# Patient Record
Sex: Female | Born: 1965
Health system: Southern US, Community
[De-identification: ages and names within clinical notes are randomized; demographics above are authoritative.]

## PROBLEM LIST (undated history)

## (undated) DIAGNOSIS — F329 Major depressive disorder, single episode, unspecified: Secondary | ICD-10-CM

## (undated) DIAGNOSIS — N907 Vulvar cyst: Secondary | ICD-10-CM

## (undated) DIAGNOSIS — F32A Depression, unspecified: Secondary | ICD-10-CM

## (undated) DIAGNOSIS — IMO0002 Reserved for concepts with insufficient information to code with codable children: Secondary | ICD-10-CM

## (undated) DIAGNOSIS — S7290XA Unspecified fracture of unspecified femur, initial encounter for closed fracture: Secondary | ICD-10-CM

## (undated) DIAGNOSIS — S069XAA Unspecified intracranial injury with loss of consciousness status unknown, initial encounter: Secondary | ICD-10-CM

## (undated) DIAGNOSIS — J069 Acute upper respiratory infection, unspecified: Secondary | ICD-10-CM

## (undated) DIAGNOSIS — J45909 Unspecified asthma, uncomplicated: Secondary | ICD-10-CM

## (undated) DIAGNOSIS — S069X9A Unspecified intracranial injury with loss of consciousness of unspecified duration, initial encounter: Secondary | ICD-10-CM

## (undated) DIAGNOSIS — R4020 Unspecified coma: Secondary | ICD-10-CM

## (undated) HISTORY — PX: TYMPANOSTOMY TUBE PLACEMENT: SHX32

## (undated) HISTORY — DX: Unspecified fracture of unspecified femur, initial encounter for closed fracture: S72.90XA

## (undated) HISTORY — DX: Vulvar cyst: N90.7

## (undated) HISTORY — DX: Unspecified asthma, uncomplicated: J45.909

## (undated) HISTORY — DX: Depression, unspecified: F32.A

## (undated) HISTORY — DX: Unspecified intracranial injury with loss of consciousness of unspecified duration, initial encounter: S06.9X9A

## (undated) HISTORY — DX: Reserved for concepts with insufficient information to code with codable children: IMO0002

## (undated) HISTORY — DX: Unspecified intracranial injury with loss of consciousness status unknown, initial encounter: S06.9XAA

## (undated) HISTORY — DX: Acute upper respiratory infection, unspecified: J06.9

## (undated) HISTORY — PX: MASTOIDECTOMY: SHX711

## (undated) HISTORY — DX: Unspecified coma: R40.20

## (undated) HISTORY — DX: Major depressive disorder, single episode, unspecified: F32.9

## (undated) HISTORY — PX: TONSILLECTOMY AND ADENOIDECTOMY: SUR1326

---

## 1999-03-07 ENCOUNTER — Other Ambulatory Visit: Admission: RE | Admit: 1999-03-07 | Discharge: 1999-03-07 | Payer: Self-pay | Admitting: *Deleted

## 2000-03-19 ENCOUNTER — Other Ambulatory Visit: Admission: RE | Admit: 2000-03-19 | Discharge: 2000-03-19 | Payer: Self-pay | Admitting: *Deleted

## 2001-03-28 ENCOUNTER — Other Ambulatory Visit: Admission: RE | Admit: 2001-03-28 | Discharge: 2001-03-28 | Payer: Self-pay | Admitting: *Deleted

## 2002-05-08 ENCOUNTER — Other Ambulatory Visit: Admission: RE | Admit: 2002-05-08 | Discharge: 2002-05-08 | Payer: Self-pay | Admitting: *Deleted

## 2003-07-12 ENCOUNTER — Inpatient Hospital Stay (HOSPITAL_COMMUNITY): Admission: AD | Admit: 2003-07-12 | Discharge: 2003-07-15 | Payer: Self-pay | Admitting: Obstetrics & Gynecology

## 2003-07-13 ENCOUNTER — Encounter (INDEPENDENT_AMBULATORY_CARE_PROVIDER_SITE_OTHER): Payer: Self-pay | Admitting: Specialist

## 2003-07-16 ENCOUNTER — Encounter: Admission: RE | Admit: 2003-07-16 | Discharge: 2003-08-15 | Payer: Self-pay | Admitting: *Deleted

## 2004-08-03 ENCOUNTER — Other Ambulatory Visit: Admission: RE | Admit: 2004-08-03 | Discharge: 2004-08-03 | Payer: Self-pay | Admitting: *Deleted

## 2005-09-27 ENCOUNTER — Other Ambulatory Visit: Admission: RE | Admit: 2005-09-27 | Discharge: 2005-09-27 | Payer: Self-pay | Admitting: *Deleted

## 2005-11-03 ENCOUNTER — Encounter: Admission: RE | Admit: 2005-11-03 | Discharge: 2005-11-03 | Payer: Self-pay | Admitting: *Deleted

## 2006-11-21 ENCOUNTER — Other Ambulatory Visit: Admission: RE | Admit: 2006-11-21 | Discharge: 2006-11-21 | Payer: Self-pay | Admitting: *Deleted

## 2007-04-12 ENCOUNTER — Encounter: Admission: RE | Admit: 2007-04-12 | Discharge: 2007-04-12 | Payer: Self-pay | Admitting: *Deleted

## 2007-12-10 ENCOUNTER — Other Ambulatory Visit: Admission: RE | Admit: 2007-12-10 | Discharge: 2007-12-10 | Payer: Self-pay | Admitting: *Deleted

## 2008-04-14 ENCOUNTER — Encounter: Admission: RE | Admit: 2008-04-14 | Discharge: 2008-04-14 | Payer: Self-pay | Admitting: *Deleted

## 2009-05-19 ENCOUNTER — Encounter: Admission: RE | Admit: 2009-05-19 | Discharge: 2009-05-19 | Payer: Self-pay | Admitting: Gynecology

## 2010-06-13 ENCOUNTER — Ambulatory Visit: Payer: Self-pay | Admitting: Gynecology

## 2010-06-13 ENCOUNTER — Other Ambulatory Visit: Admission: RE | Admit: 2010-06-13 | Discharge: 2010-06-13 | Payer: Self-pay | Admitting: Gynecology

## 2010-06-17 ENCOUNTER — Ambulatory Visit: Payer: Self-pay | Admitting: Gynecology

## 2010-06-17 DIAGNOSIS — N907 Vulvar cyst: Secondary | ICD-10-CM

## 2010-06-17 HISTORY — DX: Vulvar cyst: N90.7

## 2011-06-22 ENCOUNTER — Ambulatory Visit (INDEPENDENT_AMBULATORY_CARE_PROVIDER_SITE_OTHER): Payer: Medicare Other | Admitting: Gynecology

## 2011-06-22 ENCOUNTER — Encounter: Payer: Self-pay | Admitting: Gynecology

## 2011-06-22 DIAGNOSIS — R82998 Other abnormal findings in urine: Secondary | ICD-10-CM

## 2011-06-22 DIAGNOSIS — N949 Unspecified condition associated with female genital organs and menstrual cycle: Secondary | ICD-10-CM

## 2011-06-22 DIAGNOSIS — B3731 Acute candidiasis of vulva and vagina: Secondary | ICD-10-CM

## 2011-06-22 DIAGNOSIS — N898 Other specified noninflammatory disorders of vagina: Secondary | ICD-10-CM

## 2011-06-22 DIAGNOSIS — B373 Candidiasis of vulva and vagina: Secondary | ICD-10-CM

## 2011-06-22 DIAGNOSIS — Z202 Contact with and (suspected) exposure to infections with a predominantly sexual mode of transmission: Secondary | ICD-10-CM

## 2011-06-22 DIAGNOSIS — IMO0002 Reserved for concepts with insufficient information to code with codable children: Secondary | ICD-10-CM

## 2011-06-22 LAB — RPR

## 2011-06-22 LAB — HEPATITIS C ANTIBODY: HCV Ab: NEGATIVE

## 2011-06-22 MED ORDER — FLUCONAZOLE 150 MG PO TABS: 150.0000 mg | ORAL_TABLET | Freq: Once | ORAL | Status: AC

## 2011-06-22 NOTE — Progress Notes (Signed)
Melinda Anderson 05/29/1966 [id number]        45 y.o. presents for exam possible exposure to STDs. Patient relates having unprotected intercourse last week x2. She does not know of any known exposure but did not know her partner that well and is concerned she may have been exposed to STDs. She does note some vaginal irritation and some irritative dyspareunia over the last several weeks.  Past medical history,surgical history, allergies, family history and social history were all reviewed and documented in the EPIC chart. ROS:  Was performed and pertinent positives and negatives are included in the history.  Exam: chaperone present Filed Vitals:   06/22/11 1446  BP: 116/62   General appearance  Normal Skin grossly normal Head/Neck normal with no cervical or supraclavicular adenopathy thyroid normal Lungs  clear Cardiac RR, without RMG Abdominal  soft, nontender, without masses, organomegaly or hernia Breasts  examined lying and sitting without masses, retractions, discharge or axillary adenopathy. Pelvic  Ext/BUS/vagina  normal with whitish discharge KOH wet prep done  Cervix  normal  GC Chlamydia screen done Pap not done  Uterus  anteverted, normal size, shape and contour, midline and mobile nontender   Adnexa  Without masses or tenderness    Anus and perineum  normal   Rectovaginal  normal sphincter tone without palpated masses or tenderness.    Assessment/Plan:  45 y.o. female   #1 Recent unprotected intercourse worried about STDs. I did a baseline GC Chlamydia RPR HIV hep B and hep C. I did discuss with her that the serum screens would be too early to check for recent exposure and that she is concerned with the most recent exposure that she should repeat her blood screening 3-6 months to make sure that they remain negative assuming that these are negative.   #2 Wet prep did show yeast we'll treat with Diflucan 150x1 dose.  This may account surly for vaginal irritation now as well  as her irritative dyspareunia negative for discomfort results will follow if it persists or recurs and for further evaluation.  #3 Birth control. I discussed the birth control with her. She is using condoms although did not this last episode. Based on her last menstrual period I doubt that she is at risk for pregnancy but this last coital episode last week.  She will wait to her menses and if does not start she will check pregnancy test. I did review with her birth control options. Patient states she is usually not that such an active perverse condoms and does not want to discuss alternatives. I did review the availability of Plan B in the event of either slipping or breaking of condoms or unprotected intercourse.  #4 Health maintenance. Self breast exams on a monthly basis discussed and encouraged. Patient relates having mammograms yearly. In review of her chart her last mammogram was in 2010 but she is very certain that she had one this past year and I asked her just to check to make sure that she did this.. I did not do a Pap smear today she had one last year that was normal she again swears that she was having them annually and that she has never had an abnormal Pap smear.   We will go to a less frequent screening protocol every 2-3 years. She is comfortable with this.  No routine blood work was done today and she is planning to see primary for routine health care and will arrange for it to their office.  Dara Lords MD, 4:37 PM 06/22/2011

## 2011-06-22 NOTE — Progress Notes (Signed)
Addended byCammie Mcgee T on: 06/22/2011 05:07 PM   Modules accepted: Orders

## 2011-06-26 ENCOUNTER — Telehealth: Payer: Self-pay | Admitting: *Deleted

## 2011-06-26 NOTE — Telephone Encounter (Signed)
LAB RESULT NOT BACK

## 2011-06-28 NOTE — Telephone Encounter (Signed)
PT INFORMED WITH LAB RESULT ON 06/22/11 HIV, HEP C & B, RPR.  PT INFORMED THAT GC/CHLAM WILL RAN ON Thursday AFTERNOON.

## 2012-06-24 ENCOUNTER — Other Ambulatory Visit (HOSPITAL_COMMUNITY)
Admission: RE | Admit: 2012-06-24 | Discharge: 2012-06-24 | Disposition: A | Discharge status: Home / Self Care | Payer: Medicare Other | Source: Ambulatory Visit | Attending: Gynecology | Admitting: Gynecology

## 2012-06-24 ENCOUNTER — Ambulatory Visit (INDEPENDENT_AMBULATORY_CARE_PROVIDER_SITE_OTHER): Payer: Medicare Other | Admitting: Gynecology

## 2012-06-24 ENCOUNTER — Encounter: Payer: Self-pay | Admitting: Gynecology

## 2012-06-24 VITALS — BP 110/60 | Ht 60.0 in | Wt 106.0 lb

## 2012-06-24 DIAGNOSIS — Z1151 Encounter for screening for human papillomavirus (HPV): Secondary | ICD-10-CM | POA: Insufficient documentation

## 2012-06-24 DIAGNOSIS — R5383 Other fatigue: Secondary | ICD-10-CM

## 2012-06-24 DIAGNOSIS — N899 Noninflammatory disorder of vagina, unspecified: Secondary | ICD-10-CM

## 2012-06-24 DIAGNOSIS — R5381 Other malaise: Secondary | ICD-10-CM

## 2012-06-24 DIAGNOSIS — Z124 Encounter for screening for malignant neoplasm of cervix: Secondary | ICD-10-CM

## 2012-06-24 DIAGNOSIS — N898 Other specified noninflammatory disorders of vagina: Secondary | ICD-10-CM

## 2012-06-24 DIAGNOSIS — N951 Menopausal and female climacteric states: Secondary | ICD-10-CM

## 2012-06-24 DIAGNOSIS — IMO0002 Reserved for concepts with insufficient information to code with codable children: Secondary | ICD-10-CM

## 2012-06-24 LAB — CBC WITH DIFFERENTIAL/PLATELET
Basophils Absolute: 0 10*3/uL (ref 0.0–0.1)
Eosinophils Relative: 3 % (ref 0–5)
HCT: 41.7 % (ref 36.0–46.0)
Hemoglobin: 14.3 g/dL (ref 12.0–15.0)
Lymphocytes Relative: 32 % (ref 12–46)
Lymphs Abs: 1.5 10*3/uL (ref 0.7–4.0)
MCV: 90.7 fL (ref 78.0–100.0)
Monocytes Absolute: 0.5 10*3/uL (ref 0.1–1.0)
Monocytes Relative: 10 % (ref 3–12)
Neutro Abs: 2.6 10*3/uL (ref 1.7–7.7)
RBC: 4.6 MIL/uL (ref 3.87–5.11)
RDW: 14.2 % (ref 11.5–15.5)
WBC: 4.7 10*3/uL (ref 4.0–10.5)

## 2012-06-24 LAB — COMPREHENSIVE METABOLIC PANEL
AST: 20 U/L (ref 0–37)
Albumin: 4.3 g/dL (ref 3.5–5.2)
BUN: 11 mg/dL (ref 6–23)
CO2: 26 mEq/L (ref 19–32)
Calcium: 9.3 mg/dL (ref 8.4–10.5)
Chloride: 103 mEq/L (ref 96–112)
Creat: 0.9 mg/dL (ref 0.50–1.10)
Potassium: 4.3 mEq/L (ref 3.5–5.3)

## 2012-06-24 LAB — WET PREP FOR TRICH, YEAST, CLUE
Clue Cells Wet Prep HPF POC: NONE SEEN
Trich, Wet Prep: NONE SEEN
Yeast Wet Prep HPF POC: NONE SEEN

## 2012-06-24 LAB — FOLLICLE STIMULATING HORMONE: FSH: 21.9 m[IU]/mL

## 2012-06-24 MED ORDER — FLUCONAZOLE 200 MG PO TABS: 200.0000 mg | ORAL_TABLET | Freq: Every day | ORAL | Status: DC

## 2012-06-24 NOTE — Patient Instructions (Signed)
Follow up for ultrasound as scheduled and lab work.  Consider Stop Smoking.  Help is available at [facility] smoking cessation program @ [email or web address] or (931)062-0952. OR 1-800-QUIT-NOW 629-232-4978) for free smoking cessation counseling.   Smoking Hazards Smoking cigarettes is extremely bad for your health. Tobacco smoke has over 200 known poisons in it. There are over 60 chemicals in tobacco smoke that cause cancer. Some of the chemicals found in cigarette smoke include:  Cyanide.  Benzene.  Formaldehyde.  Methanol (wood alcohol).  Acetylene (fuel used in welding torches).  Ammonia.  Cigarette smoke also contains the poisonous gases nitrogen oxide and carbon monoxide.  Cigarette smokers have an increased risk of many serious medical problems, including: Lung cancer.  Lung disease (such as pneumonia, bronchitis, and emphysema).  Heart attack and chest pain due to the heart not getting enough oxygen (angina).  Heart disease and peripheral blood vessel disease.  Hypertension.  Stroke.  Oral cancer (cancer of the lip, mouth, or voice box).  Bladder cancer.  Pancreatic cancer.  Cervical cancer.  Pregnancy complications, including premature birth.  Low birthweight babies.  Early menopause.  Lower estrogen level for women.  Infertility.  Facial wrinkles.  Blindness.  Increased risk of broken bones (fractures).  Senile dementia.  Stillbirths and smaller newborn babies, birth defects, and genetic damage to sperm.  Stomach ulcers and internal bleeding.  Children of smokers have an increased risk of the following, because of secondhand smoke exposure:  Sudden infant death syndrome (SIDS).  Respiratory infections.  Lung cancer.  Heart disease.  Ear infections.  Smoking causes approximately: 90% of all lung cancer deaths in men.  80% of all lung cancer deaths in women.  90% of deaths from chronic obstructive lung disease.  Compared with nonsmokers, smoking  increases the risk of: Coronary heart disease by 2 to 4 times.  Stroke by 2 to 4 times.  Men developing lung cancer by 23 times.  Women developing lung cancer by 13 times.  Dying from chronic obstructive lung diseases by 12 times.  Someone who smokes 2 packs a day loses about 8 years of his or her life. Even smoking lightly shortens your life expectancy by several years. You can greatly reduce the risk of medical problems for you and your family by stopping now. Smoking is the most preventable cause of death and disease in our society. Within days of quitting smoking, your circulation returns to normal, you decrease the risk of having a heart attack, and your lung capacity improves. There may be some increased phlegm in the first few days after quitting, and it may take months for your lungs to clear up completely. Quitting for 10 years cuts your lung cancer risk to almost that of a nonsmoker. WHY IS SMOKING ADDICTIVE? Nicotine is the chemical agent in tobacco that is capable of causing addiction or dependence.  When you smoke and inhale, nicotine is absorbed rapidly into the bloodstream through your lungs. Nicotine absorbed through the lungs is capable of creating a powerful addiction. Both inhaled and non-inhaled nicotine may be addictive.  Addiction studies of cigarettes and spit tobacco show that addiction to nicotine occurs mainly during the teen years, when young people begin using tobacco products.  WHAT ARE THE BENEFITS OF QUITTING?  There are many health benefits to quitting smoking.  Likelihood of developing cancer and heart disease decreases. Health improvements are seen almost immediately.  Blood pressure, pulse rate, and breathing patterns start returning to normal soon after quitting.  People  who quit may see an improvement in their overall quality of life.  Some people choose to quit all at once. Other options include nicotine replacement products, such as patches, gum, and nasal  sprays. Do not use these products without first checking with your caregiver. QUITTING SMOKING It is not easy to quit smoking. Nicotine is addicting, and longtime habits are hard to change. To start, you can write down all your reasons for quitting, tell your family and friends you want to quit, and ask for their help. Throw your cigarettes away, chew gum or cinnamon sticks, keep your hands busy, and drink extra water or juice. Go for walks and practice deep breathing to relax. Think of all the money you are saving: around $1,000 a year, for the average pack-a-day smoker. Nicotine patches and gum have been shown to improve success at efforts to stop smoking. Zyban (bupropion) is an anti-depressant drug that can be prescribed to reduce nicotine withdrawal symptoms and to suppress the urge to smoke. Smoking is an addiction with both physical and psychological effects. Joining a stop-smoking support group can help you cope with the emotional issues. For more information and advice on programs to stop smoking, call your doctor, your local hospital, or these organizations: American Lung Association - 1-800-LUNGUSA  American Cancer Society - 1-800-ACS-2345  Document Released: 12/07/2004 Document Revised: 07/12/2011 Document Reviewed: 08/11/2009 [facility] Patient Information 2012 [place], [place].  Smoking Cessation This document explains the best ways for you to quit smoking and new treatments to help. It lists new medicines that can double or triple your chances of quitting and quitting for good. It also considers ways to avoid relapses and concerns you may have about quitting, including weight gain. NICOTINE: A POWERFUL ADDICTION If you have tried to quit smoking, you know how hard it can be. It is hard because nicotine is a very addictive drug. For some people, it can be as addictive as heroin or cocaine. Usually, people make 2 or 3 tries, or more, before finally being able to quit. Each time you try to quit,  you can learn about what helps and what hurts. Quitting takes hard work and a lot of effort, but you can quit smoking. QUITTING SMOKING IS ONE OF THE MOST IMPORTANT THINGS YOU WILL EVER DO.  You will live longer, feel better, and live better.   The impact on your body of quitting smoking is felt almost immediately:   Within 20 minutes, blood pressure decreases. Pulse returns to its normal level.   After 8 hours, carbon monoxide levels in the blood return to normal. Oxygen level increases.   After 24 hours, chance of heart attack starts to decrease. Breath, hair, and body stop smelling like smoke.   After 48 hours, damaged nerve endings begin to recover. Sense of taste and smell improve.   After 72 hours, the body is virtually free of nicotine. Bronchial tubes relax and breathing becomes easier.   After 2 to 12 weeks, lungs can hold more air. Exercise becomes easier and circulation improves.   Quitting will reduce your risk of having a heart attack, stroke, cancer, or lung disease:   After 1 year, the risk of coronary heart disease is cut in half.   After 5 years, the risk of stroke falls to the same as a nonsmoker.   After 10 years, the risk of lung cancer is cut in half and the risk of other cancers decreases significantly.   After 15 years, the risk of coronary  heart disease drops, usually to the level of a nonsmoker.   If you are pregnant, quitting smoking will improve your chances of having a healthy baby.   The people you live with, especially your children, will be healthier.   You will have extra money to spend on things other than cigarettes.  FIVE KEYS TO QUITTING Studies have shown that these 5 steps will help you quit smoking and quit for good. You have the best chances of quitting if you use them together: 1. Get ready.  2. Get support and encouragement.  3. Learn new skills and behaviors.  4. Get medicine to reduce your nicotine addiction and use it correctly.   5. Be prepared for relapse or difficult situations. Be determined to continue trying to quit, even if you do not succeed at first.  1. GET READY  Set a quit date.   Change your environment.   Get rid of ALL cigarettes, ashtrays, matches, and lighters in your home, car, and place of work.   Do not let people smoke in your home.   Review your past attempts to quit. Think about what worked and what did not.   Once you quit, do not smoke. NOT EVEN A PUFF!  2. GET SUPPORT AND ENCOURAGEMENT Studies have shown that you have a better chance of being successful if you have help. You can get support in many ways.  Tell your family, friends, and coworkers that you are going to quit and need their support. Ask them not to smoke around you.   Talk to your caregivers (doctor, dentist, nurse, pharmacist, psychologist, and/or smoking counselor).   Get individual, group, or telephone counseling and support. The more counseling you have, the better your chances are of quitting. Programs are available at [employer] and health centers. Call your local health department for information about programs in your area.   Spiritual beliefs and practices may help some smokers quit.   Quit meters are Photographer that keep track of quit statistics, such as amount of "quit-time," cigarettes not smoked, and money saved.   Many smokers find one or more of the many self-help books available useful in helping them quit and stay off tobacco.  3. LEARN NEW SKILLS AND BEHAVIORS  Try to distract yourself from urges to smoke. Talk to someone, go for a walk, or occupy your time with a task.   When you first try to quit, change your routine. Take a different route to work. Drink tea instead of coffee. Eat breakfast in a different place.   Do something to reduce your stress. Take a hot bath, exercise, or read a book.   Plan something enjoyable to do every day. Reward yourself for  not smoking.   Explore interactive web-based programs that specialize in helping you quit.  4. GET MEDICINE AND USE IT CORRECTLY Medicines can help you stop smoking and decrease the urge to smoke. Combining medicine with the above behavioral methods and support can quadruple your chances of successfully quitting smoking. The U.S. Food and Drug Administration (FDA) has approved 7 medicines to help you quit smoking. These medicines fall into 3 categories.  Nicotine replacement therapy (delivers nicotine to your body without the negative effects and risks of smoking):   Nicotine gum: Available over-the-counter.   Nicotine lozenges: Available over-the-counter.   Nicotine inhaler: Available by prescription.   Nicotine nasal spray: Available by prescription.   Nicotine skin patches (transdermal): Available by prescription and over-the-counter.  Antidepressant medicine (helps people abstain from smoking, but how this works is unknown):   Bupropion sustained-release (SR) tablets: Available by prescription.   Nicotinic receptor partial agonist (simulates the effect of nicotine in your brain):   Varenicline tartrate tablets: Available by prescription.   Ask your caregiver for advice about which medicines to use and how to use them. Carefully read the information on the package.   Everyone who is trying to quit may benefit from using a medicine. If you are pregnant or trying to become pregnant, nursing an infant, you are under age 26, or you smoke fewer than 10 cigarettes per day, talk to your caregiver before taking any nicotine replacement medicines.   You should stop using a nicotine replacement product and call your caregiver if you experience nausea, dizziness, weakness, vomiting, fast or irregular heartbeat, mouth problems with the lozenge or gum, or redness or swelling of the skin around the patch that does not go away.   Do not use any other product containing nicotine while using a  nicotine replacement product.   Talk to your caregiver before using these products if you have diabetes, heart disease, asthma, stomach ulcers, you had a recent heart attack, you have high blood pressure that is not controlled with medicine, a history of irregular heartbeat, or you have been prescribed medicine to help you quit smoking.  5. BE PREPARED FOR RELAPSE OR DIFFICULT SITUATIONS  Most relapses occur within the first 3 months after quitting. Do not be discouraged if you start smoking again. Remember, most people try several times before they finally quit.   You may have symptoms of withdrawal because your body is used to nicotine. You may crave cigarettes, be irritable, feel very hungry, cough often, get headaches, or have difficulty concentrating.   The withdrawal symptoms are only temporary. They are strongest when you first quit, but they will go away within 10 to 14 days.  Here are some difficult situations to watch for:  Alcohol. Avoid drinking alcohol. Drinking lowers your chances of successfully quitting.   Caffeine. Try to reduce the amount of caffeine you consume. It also lowers your chances of successfully quitting.   Other smokers. Being around smoking can make you want to smoke. Avoid smokers.   Weight gain. Many smokers will gain weight when they quit, usually less than 10 pounds. Eat a healthy diet and stay active. Do not let weight gain distract you from your main goal, quitting smoking. Some medicines that help you quit smoking may also help delay weight gain. You can always lose the weight gained after you quit.   Bad mood or depression. There are a lot of ways to improve your mood other than smoking.  If you are having problems with any of these situations, talk to your caregiver. SPECIAL SITUATIONS AND CONDITIONS Studies suggest that everyone can quit smoking. Your situation or condition can give you a special reason to quit.  Pregnant women/new mothers: By  quitting, you protect your baby's health and your own.   Hospitalized patients: By quitting, you reduce health problems and help healing.   Heart attack patients: By quitting, you reduce your risk of a second heart attack.   Lung, head, and neck cancer patients: By quitting, you reduce your chance of a second cancer.   Parents of children and adolescents: By quitting, you protect your children from illnesses caused by secondhand smoke.  QUESTIONS TO THINK ABOUT Think about the following questions before you try to stop smoking.  You may want to talk about your answers with your caregiver.  Why do you want to quit?   If you tried to quit in the past, what helped and what did not?   What will be the most difficult situations for you after you quit? How will you plan to handle them?   Who can help you through the tough times? Your family? Friends? Caregiver?   What pleasures do you get from smoking? What ways can you still get pleasure if you quit?  Here are some questions to ask your caregiver:  How can you help me to be successful at quitting?   What medicine do you think would be best for me and how should I take it?   What should I do if I need more help?   What is smoking withdrawal like? How can I get information on withdrawal?  Quitting takes hard work and a lot of effort, but you can quit smoking. FOR MORE INFORMATION  Smokefree.gov (http://www.davis-sullivan.com/) provides free, accurate, evidence-based information and professional assistance to help support the immediate and long-term needs of people trying to quit smoking. Document Released: 10/24/2001 Document Revised: 07/12/2011 Document Reviewed: 08/16/2009 [facility] Patient Information 2012 [place], [place].

## 2012-06-24 NOTE — Progress Notes (Signed)
Melinda Anderson 05/04/1966 [id number]        46 y.o.  G2P0011 for follow up exam.  Several issues noted below.  Past medical history,surgical history, medications, allergies, family history and social history were all reviewed and documented in the EPIC chart. ROS:  Was performed and pertinent positives and negatives are included in the history.  Exam:  Sherrilyn Rist assistant Filed Vitals:   06/24/12 0959  BP: 110/60  Height: 5' (1.524 m)  Weight: 106 lb (48.081 kg)   General appearance  Normal Skin grossly normal Head/Neck normal with no cervical or supraclavicular adenopathy thyroid normal, slight whitish change to the tongue. Lungs  clear Cardiac RR, without RMG Abdominal  soft, nontender, without masses, organomegaly or hernia Breasts  examined lying and sitting without masses, retractions, discharge or axillary adenopathy. Pelvic  Ext/BUS/vagina  normal with white discharge  Cervix  normal Pap/HPV  Uterus  anteverted, normal size, shape and contour, midline and mobile nontender   Adnexa  Without masses or tenderness    Anus and perineum  normal   Rectovaginal  normal sphincter tone without palpated masses or tenderness.    Assessment/Plan:  46 y.o. G48P0011 female for follow up exam.   1. Vaginal irritation/thrush. Patient was treated with oral antibiotics for a root canal. Notes afterwards developed vaginal irritation and thrush. She tends to get thrush with oral antibiotics. Exam is consistent with thrush and yeast vulvovaginitis. The wet prep was negative. We'll treat with Diflucan 200 daily x7 days. Follow up if symptoms persist or recur. 2. Contraception. Patient continues use condoms. I reviewed the failure risk and availability of Plan B. Alternatives to include sterilization, IUD and progesterone only hormonal reviewed. She does smoke. Patient declines alternatives and prefers condoms accepting the failure risks. 3. Dyspareunia. Patient notes irritative and deep throbbing  dyspareunia. Exam is normal other than her vaginitis. We'll treat the vaginitis and check ultrasound rule out nonpalpable abnormalities. Assuming ultrasound negative will monitor at present. Long-term consider laparoscopy rule out endometriosis. 4. Menopausal symptoms. Patient notes some hot flashes during her menses. None otherwise. We'll check baseline FSH. 5. Fatigue. I think this is due to travel in a busy summer but will check baseline TSH contents metabolic panel and CBC. Assuming negative will monitor at present. If continues referred to internal medicine. 6. Pap smear. The patient has not had a Pap smear in several years. Pap/HPV done. No history of abnormal Pap smears before. If negative then plan every 5 year screening. 7. Mammography. Patient overdue for mammogram and I reminded her to schedule this and she agrees to do so. SBE monthly reviewed. 8. Stop smoking. I reviewed the benefits of stop smoking and encouraged her to do so. 9. Health maintenance. Patient will continue to see her primary for routine health maintenance. Follow up for ultrasound and lab work.    Dara Lords MD, 10:35 AM 06/24/2012

## 2012-06-25 LAB — URINALYSIS W MICROSCOPIC + REFLEX CULTURE
Bilirubin Urine: NEGATIVE
Glucose, UA: NEGATIVE mg/dL
Hgb urine dipstick: NEGATIVE
Protein, ur: NEGATIVE mg/dL
Urobilinogen, UA: 0.2 mg/dL (ref 0.0–1.0)

## 2012-06-28 ENCOUNTER — Ambulatory Visit (INDEPENDENT_AMBULATORY_CARE_PROVIDER_SITE_OTHER): Payer: Medicare Other

## 2012-06-28 ENCOUNTER — Encounter: Payer: Self-pay | Admitting: Gynecology

## 2012-06-28 ENCOUNTER — Ambulatory Visit (INDEPENDENT_AMBULATORY_CARE_PROVIDER_SITE_OTHER): Payer: Medicare Other | Admitting: Gynecology

## 2012-06-28 DIAGNOSIS — D259 Leiomyoma of uterus, unspecified: Secondary | ICD-10-CM

## 2012-06-28 DIAGNOSIS — IMO0002 Reserved for concepts with insufficient information to code with codable children: Secondary | ICD-10-CM

## 2012-06-28 MED ORDER — FLUCONAZOLE 200 MG PO TABS: 200.0000 mg | ORAL_TABLET | Freq: Every day | ORAL | Status: AC

## 2012-06-28 NOTE — Patient Instructions (Signed)
Take Diflucan 200 mg daily for 7 days. If your bowel symptoms continue he will need to see your primary physician or your dentist.

## 2012-06-28 NOTE — Progress Notes (Signed)
Patient presents for ultrasound due to her history of deep dyspareunia.  Ultrasound shows uterus normal size. The endometrial echo of 5.3 mm. Several small myomas noted the largest measuring 13 mm. Bilateral ovaries normal. Cul-de-sac negative.  Assessment and plan: Reviewed findings of ultrasound with patient. Options for more aggressive evaluation to include laparoscopy reviewed but she declines said that it's always been this way and she is comfortable with monitoring. She took one Diflucan apparently there was a mixup at the pharmacy instead of 7 she got one and I refilled her for a total of 7 to take one by mouth daily. If her thrush continues she'll need to see her dentist or primary physician.

## 2012-10-16 ENCOUNTER — Encounter (HOSPITAL_COMMUNITY): Payer: Self-pay | Admitting: Emergency Medicine

## 2012-10-16 ENCOUNTER — Emergency Department (HOSPITAL_COMMUNITY)
Admission: EM | Admit: 2012-10-16 | Discharge: 2012-10-16 | Disposition: A | Discharge status: Home / Self Care | Payer: No Typology Code available for payment source | Attending: Emergency Medicine | Admitting: Emergency Medicine

## 2012-10-16 DIAGNOSIS — H8110 Benign paroxysmal vertigo, unspecified ear: Secondary | ICD-10-CM

## 2012-10-16 DIAGNOSIS — Z87820 Personal history of traumatic brain injury: Secondary | ICD-10-CM | POA: Insufficient documentation

## 2012-10-16 DIAGNOSIS — Z79899 Other long term (current) drug therapy: Secondary | ICD-10-CM | POA: Insufficient documentation

## 2012-10-16 DIAGNOSIS — R42 Dizziness and giddiness: Secondary | ICD-10-CM | POA: Insufficient documentation

## 2012-10-16 DIAGNOSIS — Z87828 Personal history of other (healed) physical injury and trauma: Secondary | ICD-10-CM | POA: Insufficient documentation

## 2012-10-16 DIAGNOSIS — F17200 Nicotine dependence, unspecified, uncomplicated: Secondary | ICD-10-CM | POA: Insufficient documentation

## 2012-10-16 DIAGNOSIS — Z8742 Personal history of other diseases of the female genital tract: Secondary | ICD-10-CM | POA: Insufficient documentation

## 2012-10-16 DIAGNOSIS — F3289 Other specified depressive episodes: Secondary | ICD-10-CM | POA: Insufficient documentation

## 2012-10-16 DIAGNOSIS — M549 Dorsalgia, unspecified: Secondary | ICD-10-CM | POA: Insufficient documentation

## 2012-10-16 DIAGNOSIS — F329 Major depressive disorder, single episode, unspecified: Secondary | ICD-10-CM | POA: Insufficient documentation

## 2012-10-16 MED ORDER — MECLIZINE HCL 50 MG PO TABS: 25.0000 mg | ORAL_TABLET | Freq: Three times a day (TID) | ORAL | Status: DC | PRN
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
## 2012-10-16 NOTE — ED Provider Notes (Signed)
Medical screening examination/treatment/procedure(s) were performed by non-physician practitioner and as supervising physician I was immediately available for consultation/collaboration.  Allien Melberg, MD 10/16/12 1706 
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
## 2012-10-29 ENCOUNTER — Encounter (HOSPITAL_COMMUNITY): Payer: Self-pay | Admitting: Emergency Medicine

## 2012-10-29 ENCOUNTER — Emergency Department (HOSPITAL_COMMUNITY)
Admission: EM | Admit: 2012-10-29 | Discharge: 2012-10-29 | Disposition: A | Discharge status: Home / Self Care | Payer: No Typology Code available for payment source | Attending: Emergency Medicine | Admitting: Emergency Medicine

## 2012-10-29 DIAGNOSIS — Z8781 Personal history of (healed) traumatic fracture: Secondary | ICD-10-CM | POA: Insufficient documentation

## 2012-10-29 DIAGNOSIS — S298XXA Other specified injuries of thorax, initial encounter: Secondary | ICD-10-CM | POA: Insufficient documentation

## 2012-10-29 DIAGNOSIS — F17200 Nicotine dependence, unspecified, uncomplicated: Secondary | ICD-10-CM | POA: Insufficient documentation

## 2012-10-29 DIAGNOSIS — Z87820 Personal history of traumatic brain injury: Secondary | ICD-10-CM | POA: Insufficient documentation

## 2012-10-29 DIAGNOSIS — F329 Major depressive disorder, single episode, unspecified: Secondary | ICD-10-CM | POA: Insufficient documentation

## 2012-10-29 DIAGNOSIS — Y92410 Unspecified street and highway as the place of occurrence of the external cause: Secondary | ICD-10-CM | POA: Insufficient documentation

## 2012-10-29 DIAGNOSIS — Z79899 Other long term (current) drug therapy: Secondary | ICD-10-CM | POA: Insufficient documentation

## 2012-10-29 DIAGNOSIS — Y9389 Activity, other specified: Secondary | ICD-10-CM | POA: Insufficient documentation

## 2012-10-29 DIAGNOSIS — F3289 Other specified depressive episodes: Secondary | ICD-10-CM | POA: Insufficient documentation

## 2012-10-29 DIAGNOSIS — Z8742 Personal history of other diseases of the female genital tract: Secondary | ICD-10-CM | POA: Insufficient documentation
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
## 2012-10-29 NOTE — ED Notes (Signed)
MD at bedside. 

## 2012-10-30 NOTE — ED Provider Notes (Signed)
Medical screening examination/treatment/procedure(s) were performed by non-physician practitioner and as supervising physician I was immediately available for consultation/collaboration.   Leata Dominy M Jenalee Trevizo, MD 10/30/12 0853 

## 2014-09-14 ENCOUNTER — Encounter (HOSPITAL_COMMUNITY): Payer: Self-pay | Admitting: Emergency Medicine

## 2015-02-15 ENCOUNTER — Encounter: Payer: Self-pay | Admitting: Gynecology

## 2015-02-15 ENCOUNTER — Ambulatory Visit (INDEPENDENT_AMBULATORY_CARE_PROVIDER_SITE_OTHER): Payer: Medicare Other | Admitting: Gynecology

## 2015-02-15 VITALS — BP 120/70

## 2015-02-15 DIAGNOSIS — T192XXA Foreign body in vulva and vagina, initial encounter: Secondary | ICD-10-CM

## 2015-02-15 DIAGNOSIS — N3000 Acute cystitis without hematuria: Secondary | ICD-10-CM | POA: Diagnosis not present

## 2015-02-15 LAB — URINALYSIS W MICROSCOPIC + REFLEX CULTURE
BILIRUBIN URINE: NEGATIVE
CASTS: NONE SEEN
CRYSTALS: NONE SEEN
GLUCOSE, UA: NEGATIVE mg/dL
KETONES UR: NEGATIVE mg/dL
Nitrite: NEGATIVE
PH: 5 (ref 5.0–8.0)
Protein, ur: NEGATIVE mg/dL
UROBILINOGEN UA: 0.2 mg/dL (ref 0.0–1.0)

## 2015-02-15 MED ORDER — CIPROFLOXACIN HCL 250 MG PO TABS: 250.0000 mg | ORAL_TABLET | Freq: Two times a day (BID) | ORAL | Status: DC

## 2015-02-15 NOTE — Addendum Note (Signed)
Addended by: Nelva Nay on: 02/15/2015 02:12 PM   Modules accepted: Orders

## 2015-02-15 NOTE — Progress Notes (Signed)
AGAM TUOHY 1966/11/12 [id number]        49 y.o.  G2P0011 present with one-week history of frequency, mild low back pain and slight pelvic discomfort. No fever chills nausea vomiting diarrhea constipation. No dysuria or urgency. No vaginal discharge, irritation or odor.  Past medical history,surgical history, problem list, medications, allergies, family history and social history were all reviewed and documented in the EPIC chart.  Directed ROS with pertinent positives and negatives documented in the history of present illness/assessment and plan.  Exam: Kim assistant Filed Vitals:   02/15/15 1234  BP: 120/70   General appearance:  Normal Spine straight without CVA tenderness Abdomen soft nontender without masses guarding rebound Pelvic external BUS vagina with retained tampon, removed and discarded. Cervix grossly normal. Uterus normal size midline mobile nontender. Adnexa without masses or tenderness.  Assessment/Plan:  49 y.o. G2P0011 with retained tampon. LMP 01/29/2015. Urinalysis does show 3-6 WBC 7-10 RBC and few bacteria. Suspect low-grade UTI as well as the retained tampon leading to some of her pelvic discomfort.  Will cover for UTI with ciprofloxacin 250 mg twice a day 7 days. Follow up if symptoms persist, worsen or recur.  Patient has her annual exam for which she is overdue scheduled in June.    Anastasio Auerbach MD, 12:51 PM 02/15/2015

## 2015-02-15 NOTE — Patient Instructions (Signed)
Take the antibiotic twice daily for 7 days. Follow up if symptoms persist, worsen or recur.

## 2015-02-18 LAB — URINE CULTURE

## 2015-04-14 DIAGNOSIS — IMO0002 Reserved for concepts with insufficient information to code with codable children: Secondary | ICD-10-CM

## 2015-04-14 HISTORY — DX: Reserved for concepts with insufficient information to code with codable children: IMO0002

## 2015-04-19 ENCOUNTER — Ambulatory Visit (INDEPENDENT_AMBULATORY_CARE_PROVIDER_SITE_OTHER): Payer: Medicare Other | Admitting: Gynecology

## 2015-04-19 ENCOUNTER — Other Ambulatory Visit (HOSPITAL_COMMUNITY)
Admission: RE | Admit: 2015-04-19 | Discharge: 2015-04-19 | Disposition: A | Discharge status: Home / Self Care | Payer: Medicare Other | Source: Ambulatory Visit | Attending: Gynecology | Admitting: Gynecology

## 2015-04-19 ENCOUNTER — Encounter: Payer: Self-pay | Admitting: Gynecology

## 2015-04-19 VITALS — BP 118/72 | Ht 60.0 in | Wt 103.4 lb

## 2015-04-19 DIAGNOSIS — N951 Menopausal and female climacteric states: Secondary | ICD-10-CM

## 2015-04-19 DIAGNOSIS — Z01419 Encounter for gynecological examination (general) (routine) without abnormal findings: Secondary | ICD-10-CM | POA: Diagnosis not present

## 2015-04-19 DIAGNOSIS — R87619 Unspecified abnormal cytological findings in specimens from cervix uteri: Secondary | ICD-10-CM | POA: Diagnosis present

## 2015-04-19 DIAGNOSIS — Z1151 Encounter for screening for human papillomavirus (HPV): Secondary | ICD-10-CM | POA: Insufficient documentation

## 2015-04-19 DIAGNOSIS — N926 Irregular menstruation, unspecified: Secondary | ICD-10-CM

## 2015-04-19 DIAGNOSIS — Z124 Encounter for screening for malignant neoplasm of cervix: Secondary | ICD-10-CM | POA: Diagnosis present

## 2015-04-19 NOTE — Progress Notes (Signed)
Melinda Anderson [birth year] [id number]        49 y.o.  G2P0011 for breast and pelvic exam. Several issues noted below.  Past medical history,surgical history, problem list, medications, allergies, family history and social history were all reviewed and documented as reviewed in the EPIC chart.  ROS:  Performed with pertinent positives and negatives included in the history, assessment and plan.   Additional significant findings :  none   Exam: Administrator, [employer] Vitals:   04/19/15 1132  BP: 118/72  Height: 5' (1.524 m)  Weight: 103 lb 6.4 oz (46.902 kg)   General appearance:  Normal affect, orientation and appearance. Skin: Grossly normal HEENT: Without gross lesions.  No cervical or supraclavicular adenopathy. Thyroid normal.  Lungs:  Clear without wheezing, rales or rhonchi Cardiac: RR, without RMG Abdominal:  Soft, nontender, without masses, guarding, rebound, organomegaly or hernia Breasts:  Examined lying and sitting without masses, retractions, discharge or axillary adenopathy. Pelvic:  Ext/BUS/vagina normal  Cervix normal. Pap smear/HPV  Uterus anteverted, normal size, shape and contour, midline and mobile nontender   Adnexa  Without masses or tenderness    Anus and perineum  Normal   Rectovaginal  Normal sphincter tone without palpated masses or tenderness.    Assessment/Plan:  49 y.o. G44P0011 female for breast and pelvic exam with irregular menses, not using consistent contraception.   1. Irregular menses/menopausal symptoms. Patient notes last menstrual period March. Was having relatively regular menses before this. Now is also having daily hot flushes. Check baseline labs to include Dexter TSH prolactin and hCG. Options for management up to including HRT discussed. Patient is not interested in intervention at this time. Issues with HRT to include slight increased risk of blood clots as well as breast cancer also discussed. Will keep menstrual calendar as long as less  frequent but regular menses will follow. Of prolonged or atypical bleeding will follow up for further evaluation. 2. Contraception. Not consistently using contraception. Check hCG today. Need for at least backup contraception with condoms reviewed. Partner is planning vasectomy but has not followed up for this yet. 3. Mammography 2010. Need to schedule mammography stressed. Patient agrees to do so. SBE monthly reviewed. 4. Pap smear 2013. Pap smear/HPV today. No history of significant abnormal Pap smears previously. 5. Health maintenance. No routine blood work done as this is done at her primary physician's office. Follow up in one year, sooner as needed.     Anastasio Auerbach MD, 11:57 AM 04/19/2015

## 2015-04-19 NOTE — Addendum Note (Signed)
Addended by: Thamas Jaegers on: 04/19/2015 12:21 PM   Modules accepted: Orders

## 2015-04-19 NOTE — Patient Instructions (Addendum)
Office will call you with the lab results.  Use backup contraception.  Call to Schedule your mammogram  Facilities in [place]: 1)  [facility], [place] Phone: [phone] 2)  [facility]. [address] [place] Phone: [phone] 3)  Dr. Isaiah Blakes at [facility] [address] Phone: [phone]     Mammogram A mammogram is an X-ray test to find changes in a woman's breast. You should get a mammogram if:  You are 19 years of age or older  You have risk factors.   Your doctor recommends that you have one.  BEFORE THE TEST  Do not schedule the test the week before your period, especially if your breasts are sore during this time.  On the day of your mammogram:  Wash your breasts and armpits well. After washing, do not put on any deodorant or talcum powder on until after your test.   Eat and drink as you usually do.   Take your medicines as usual.   If you are diabetic and take insulin, make sure you:   Eat before coming for your test.   Take your insulin as usual.   If you cannot keep your appointment, call before the appointment to cancel. Schedule another appointment.  TEST  You will need to undress from the waist up. You will put on a hospital gown.   Your breast will be put on the mammogram machine, and it will press firmly on your breast with a piece of plastic called a compression paddle. This will make your breast flatter so that the machine can X-ray all parts of your breast.   Both breasts will be X-rayed. Each breast will be X-rayed from above and from the side. An X-ray might need to be taken again if the picture is not good enough.   The mammogram will last about 15 to 30 minutes.  AFTER THE TEST Finding out the results of your test Ask when your test results will be ready. Make sure you get your test results.  Document Released: 01/26/2009  Document Revised: 10/19/2011 Document Reviewed: 01/26/2009 [facility] Patient Information 2012 [place].  You may obtain a copy of any labs that were done today by logging onto MyChart as outlined in the instructions provided with your AVS (after visit summary). The office will not call with normal lab results but certainly if there are any significant abnormalities then we will contact you.   Health Maintenance, Female A healthy lifestyle and preventative care can promote health and wellness.  Maintain regular health, dental, and eye exams.  Eat a healthy diet. Foods like vegetables, fruits, whole grains, low-fat dairy products, and lean protein foods contain the nutrients you need without too many calories. Decrease your intake of foods high in solid fats, added sugars, and salt. Get information about a proper diet from your caregiver, if necessary.  Regular physical exercise is one of the most important things you can do for your health. Most adults should get at least 150 minutes of moderate-intensity exercise (any activity that increases your heart rate and causes you to sweat) each week. In addition, most adults need muscle-strengthening exercises on 2 or more days a week.   Maintain a healthy weight. The body mass index (BMI) is a screening tool to identify possible weight problems. It provides an estimate of body fat based on height and weight. Your caregiver can help determine your  BMI, and can help you achieve or maintain a healthy weight. For adults 20 years and older:  A BMI below 18.5 is considered underweight.  A BMI of 18.5 to 24.9 is normal.  A BMI of 25 to 29.9 is considered overweight.  A BMI of 30 and above is considered obese.  Maintain normal blood lipids and cholesterol by exercising and minimizing your intake of saturated fat. Eat a balanced diet with plenty of fruits and vegetables. Blood tests for lipids and cholesterol should begin at age 65 and be repeated  every 5 years. If your lipid or cholesterol levels are high, you are over 50, or you are a high risk for heart disease, you may need your cholesterol levels checked more frequently.Ongoing high lipid and cholesterol levels should be treated with medicines if diet and exercise are not effective.  If you smoke, find out from your caregiver how to quit. If you do not use tobacco, do not start.  Lung cancer screening is recommended for adults aged 42 80 years who are at high risk for developing lung cancer because of a history of smoking. Yearly low-dose computed tomography (CT) is recommended for people who have at least a 30-pack-year history of smoking and are a current smoker or have quit within the past 15 years. A pack year of smoking is smoking an average of 1 pack of cigarettes a day for 1 year (for example: 1 pack a day for 30 years or 2 packs a day for 15 years). Yearly screening should continue until the smoker has stopped smoking for at least 15 years. Yearly screening should also be stopped for people who develop a health problem that would prevent them from having lung cancer treatment.  If you are pregnant, do not drink alcohol. If you are breastfeeding, be very cautious about drinking alcohol. If you are not pregnant and choose to drink alcohol, do not exceed 1 drink per day. One drink is considered to be 12 ounces (355 mL) of beer, 5 ounces (148 mL) of wine, or 1.5 ounces (44 mL) of liquor.  Avoid use of street drugs. Do not share needles with anyone. Ask for help if you need support or instructions about stopping the use of drugs.  High blood pressure causes heart disease and increases the risk of stroke. Blood pressure should be checked at least every 1 to 2 years. Ongoing high blood pressure should be treated with medicines, if weight loss and exercise are not effective.  If you are 69 to 48 years old, ask your caregiver if you should take aspirin to prevent strokes.  Diabetes  screening involves taking a blood sample to check your fasting blood sugar level. This should be done once every 3 years, after age 23, if you are within normal weight and without risk factors for diabetes. Testing should be considered at a younger age or be carried out more frequently if you are overweight and have at least 1 risk factor for diabetes.  Breast cancer screening is essential preventative care for women. You should practice "breast self-awareness." This means understanding the normal appearance and feel of your breasts and may include breast self-examination. Any changes detected, no matter how small, should be reported to a caregiver. Women in their 14s and 30s should have a clinical breast exam (CBE) by a caregiver as part of a regular health exam every 1 to 3 years. After age 64, women should have a CBE every year. Starting at age 32, women  should consider having a mammogram (breast X-ray) every year. Women who have a family history of breast cancer should talk to their caregiver about genetic screening. Women at a high risk of breast cancer should talk to their caregiver about having an MRI and a mammogram every year.  Breast cancer gene (BRCA)-related cancer risk assessment is recommended for women who have family members with BRCA-related cancers. BRCA-related cancers include breast, ovarian, tubal, and peritoneal cancers. Having family members with these cancers may be associated with an increased risk for harmful changes (mutations) in the breast cancer genes BRCA1 and BRCA2. Results of the assessment will determine the need for genetic counseling and BRCA1 and BRCA2 testing.  The Pap test is a screening test for cervical cancer. Women should have a Pap test starting at age 81. Between ages 63 and 38, Pap tests should be repeated every 2 years. Beginning at age 41, you should have a Pap test every 3 years as long as the past 3 Pap tests have been normal. If you had a hysterectomy for a  problem that was not cancer or a condition that could lead to cancer, then you no longer need Pap tests. If you are between ages 49 and 63, and you have had normal Pap tests going back 10 years, you no longer need Pap tests. If you have had past treatment for cervical cancer or a condition that could lead to cancer, you need Pap tests and screening for cancer for at least 20 years after your treatment. If Pap tests have been discontinued, risk factors (such as a new sexual partner) need to be reassessed to determine if screening should be resumed. Some women have medical problems that increase the chance of getting cervical cancer. In these cases, your caregiver may recommend more frequent screening and Pap tests.  The human papillomavirus (HPV) test is an additional test that may be used for cervical cancer screening. The HPV test looks for the virus that can cause the cell changes on the cervix. The cells collected during the Pap test can be tested for HPV. The HPV test could be used to screen women aged 2 years and older, and should be used in women of any age who have unclear Pap test results. After the age of 4, women should have HPV testing at the same frequency as a Pap test.  Colorectal cancer can be detected and often prevented. Most routine colorectal cancer screening begins at the age of 26 and continues through age 66. However, your caregiver may recommend screening at an earlier age if you have risk factors for colon cancer. On a yearly basis, your caregiver may provide home test kits to check for hidden blood in the stool. Use of a small camera at the end of a tube, to directly examine the colon (sigmoidoscopy or colonoscopy), can detect the earliest forms of colorectal cancer. Talk to your caregiver about this at age 58, when routine screening begins. Direct examination of the colon should be repeated every 5 to 10 years through age 27, unless early forms of pre-cancerous polyps or small growths  are found.  Hepatitis C blood testing is recommended for all people born from [birth year] through 1964 and any individual with known risks for hepatitis C.  Practice safe sex. Use condoms and avoid high-risk sexual practices to reduce the spread of sexually transmitted infections (STIs). Sexually active women aged 98 and younger should be checked for Chlamydia, which is a common sexually transmitted infection. Older women  with new or multiple partners should also be tested for Chlamydia. Testing for other STIs is recommended if you are sexually active and at increased risk.  Osteoporosis is a disease in which the bones lose minerals and strength with aging. This can result in serious bone fractures. The risk of osteoporosis can be identified using a bone density scan. Women ages 52 and over and women at risk for fractures or osteoporosis should discuss screening with their caregivers. Ask your caregiver whether you should be taking a calcium supplement or vitamin D to reduce the rate of osteoporosis.  Menopause can be associated with physical symptoms and risks. Hormone replacement therapy is available to decrease symptoms and risks. You should talk to your caregiver about whether hormone replacement therapy is right for you.  Use sunscreen. Apply sunscreen liberally and repeatedly throughout the day. You should seek shade when your shadow is shorter than you. Protect yourself by wearing long sleeves, pants, a wide-brimmed hat, and sunglasses year round, whenever you are outdoors.  Notify your caregiver of new moles or changes in moles, especially if there is a change in shape or color. Also notify your caregiver if a mole is larger than the size of a pencil eraser.  Stay current with your immunizations. Document Released: 05/15/2011 Document Revised: 02/24/2013 Document Reviewed: 05/15/2011 [facility] Patient Information 2014 [place].

## 2015-04-20 LAB — HCG, SERUM, QUALITATIVE: Preg, Serum: NEGATIVE

## 2015-04-20 LAB — TSH: TSH: 2.736 u[IU]/mL (ref 0.350–4.500)

## 2015-04-20 LAB — PROLACTIN: Prolactin: 4.7 ng/mL

## 2015-04-20 LAB — FOLLICLE STIMULATING HORMONE: FSH: 68 m[IU]/mL

## 2015-04-21 LAB — CYTOLOGY - PAP

## 2015-04-22 ENCOUNTER — Encounter: Payer: Self-pay | Admitting: Gynecology

## 2016-05-05 ENCOUNTER — Encounter: Payer: Medicare Other | Admitting: Gynecology

## 2016-06-15 ENCOUNTER — Encounter: Payer: Self-pay | Admitting: Gynecology

## 2016-06-15 ENCOUNTER — Ambulatory Visit (INDEPENDENT_AMBULATORY_CARE_PROVIDER_SITE_OTHER): Payer: Medicare Other | Admitting: Gynecology

## 2016-06-15 VITALS — BP 120/76 | Ht 60.0 in | Wt 104.0 lb

## 2016-06-15 DIAGNOSIS — N951 Menopausal and female climacteric states: Secondary | ICD-10-CM

## 2016-06-15 DIAGNOSIS — R300 Dysuria: Secondary | ICD-10-CM

## 2016-06-15 DIAGNOSIS — Z01419 Encounter for gynecological examination (general) (routine) without abnormal findings: Secondary | ICD-10-CM

## 2016-06-15 DIAGNOSIS — R896 Abnormal cytological findings in specimens from other organs, systems and tissues: Secondary | ICD-10-CM | POA: Diagnosis not present

## 2016-06-15 DIAGNOSIS — Z124 Encounter for screening for malignant neoplasm of cervix: Secondary | ICD-10-CM | POA: Diagnosis not present

## 2016-06-15 DIAGNOSIS — IMO0002 Reserved for concepts with insufficient information to code with codable children: Secondary | ICD-10-CM

## 2016-06-15 NOTE — Addendum Note (Signed)
Addended by: Nelva Nay on: 06/15/2016 08:52 AM   Modules accepted: Orders

## 2016-06-15 NOTE — Patient Instructions (Signed)
Try over-the-counter Estrovin for the hot flushes.   Call to Schedule your mammogram  Facilities in [place]: 1)  [facility]. [address] [employer] [place] Phone: [phone] 2)  Dr. Isaiah Blakes at [facility] [address] Phone: [phone]     Mammogram A mammogram is an X-ray test to find changes in a woman's breast. You should get a mammogram if:  You are 49 years of age or older  You have risk factors.   Your doctor recommends that you have one.  BEFORE THE TEST  Do not schedule the test the week before your period, especially if your breasts are sore during this time.  On the day of your mammogram:  Wash your breasts and armpits well. After washing, do not put on any deodorant or talcum powder on until after your test.   Eat and drink as you usually do.   Take your medicines as usual.   If you are diabetic and take insulin, make sure you:   Eat before coming for your test.   Take your insulin as usual.   If you cannot keep your appointment, call before the appointment to cancel. Schedule another appointment.  TEST  You will need to undress from the waist up. You will put on a hospital gown.   Your breast will be put on the mammogram machine, and it will press firmly on your breast with a piece of plastic called a compression paddle. This will make your breast flatter so that the machine can X-ray all parts of your breast.   Both breasts will be X-rayed. Each breast will be X-rayed from above and from the side. An X-ray might need to be taken again if the picture is not good enough.   The mammogram will last about 15 to 30 minutes.  AFTER THE TEST Finding out the results of your test Ask when your test results will be ready. Make sure you get your test results.  Document Released: 01/26/2009 Document Revised: 10/19/2011 Document Reviewed: 01/26/2009 [facility] Patient Information 2012  [place].

## 2016-06-15 NOTE — Progress Notes (Signed)
    Melinda Anderson 12-10-1965 [id number]        50 y.o.  G2P0011  for annual exam.  Several issues noted below.  Past medical history,surgical history, problem list, medications, allergies, family history and social history were all reviewed and documented as reviewed in the EPIC chart.  ROS:  Performed with pertinent positives and negatives included in the history, assessment and plan.   Additional significant findings :  None   Exam: Melinda Anderson assistant Vitals:   06/15/16 0800  BP: 120/76  Weight: 104 lb (47.2 kg)  Height: 5' (1.524 m)   Body mass index is 20.31 kg/m.  General appearance:  Normal affect, orientation and appearance. Skin: Grossly normal HEENT: Without gross lesions.  No cervical or supraclavicular adenopathy. Thyroid normal.  Lungs:  Clear without wheezing, rales or rhonchi Cardiac: RR, without RMG Abdominal:  Soft, nontender, without masses, guarding, rebound, organomegaly or hernia Breasts:  Examined lying and sitting without masses, retractions, discharge or axillary adenopathy. Pelvic:  Ext/BUS/Vagina normal  Cervix normal. Pap smear done  Uterus anteverted, normal size, shape and contour, midline and mobile nontender   Adnexa without masses or tenderness    Anus and perineum normal   Rectovaginal normal sphincter tone without palpated masses or tenderness.    Assessment/Plan:  50 y.o. G25P0011 female for annual exam.   1. Menopausal symptoms. Patient has not had a menses over the past year. Did have elevated [place] last year. Is having worsening hot flushes and night sweats. No vaginal dryness. Reviewed options to include OTC products such as soy based up to including HRT. I reviewed HRT with her to include the risks versus benefits and various formulations. Patient is not interested in starting HRT at this time. She will try some of the OTC products to see if it does not help. She'll follow up if they worsen or she wants to rediscuss HRT. Patient also  knows to report any vaginal bleeding. 2. End stream dysuria.  Having minimal end stream dysuria over the last several weeks. Was wondering whether she was getting an early UTI. No frequency, urgency, low back pain, fever or chills. No vaginal discharge, irritation or odor. Check baseline urine analysis. If negative push fluids and monitor. If persists or worsens and consider urology referral. If results and follow expectantly. 3. Mammography 2010. Reminded patient that she is way overdue. Need to schedule stressed and she agrees to do so. SBE monthly reviewed. 4. Pap smear 2016 ASCUS with negative high-risk HPV. Pap smear done today. No history of abnormal Pap smears previously. I reviewed with her ASCUS in the probable benign nature of this given the negative high-risk HPV and no history of abnormal Pap smears previously. Discussed though if persistently abnormal will then plan colposcopy. 5. Health maintenance. No routine lab work done as this is done at her primary physician's office. Follow up 1 year, sooner as needed.  Greater than 15 minutes of my time in excess of her breast and pelvic exam was spent in direct face to face counseling and coordination of care in regards to her problems of menopausal symptoms, ASCUS Pap smear, dysuria.  Anastasio Auerbach MD, 8:22 AM 06/15/2016

## 2016-06-16 LAB — URINALYSIS W MICROSCOPIC + REFLEX CULTURE
BACTERIA UA: NONE SEEN [HPF]
Bilirubin Urine: NEGATIVE
Casts: NONE SEEN [LPF]
Crystals: NONE SEEN [HPF]
GLUCOSE, UA: NEGATIVE
HGB URINE DIPSTICK: NEGATIVE
Ketones, ur: NEGATIVE
LEUKOCYTES UA: NEGATIVE
NITRITE: NEGATIVE
PROTEIN: NEGATIVE
RBC / HPF: NONE SEEN RBC/HPF (ref <=2)
Specific Gravity, Urine: 1.02 (ref 1.001–1.035)
YEAST: NONE SEEN [HPF]
pH: 5.5 (ref 5.0–8.0)

## 2016-06-16 LAB — PAP IG W/ RFLX HPV ASCU

## 2016-06-17 LAB — URINE CULTURE: Organism ID, Bacteria: 10000

## 2016-06-20 ENCOUNTER — Encounter: Payer: Self-pay | Admitting: Gynecology

## 2016-06-20 LAB — HUMAN PAPILLOMAVIRUS, HIGH RISK: HPV DNA HIGH RISK: NOT DETECTED

## 2016-07-13 ENCOUNTER — Encounter: Payer: Self-pay | Admitting: Gynecology

## 2016-07-13 ENCOUNTER — Ambulatory Visit (INDEPENDENT_AMBULATORY_CARE_PROVIDER_SITE_OTHER): Payer: Medicare Other | Admitting: Gynecology

## 2016-07-13 VITALS — BP 114/74

## 2016-07-13 DIAGNOSIS — R896 Abnormal cytological findings in specimens from other organs, systems and tissues: Secondary | ICD-10-CM | POA: Diagnosis not present

## 2016-07-13 DIAGNOSIS — IMO0002 Reserved for concepts with insufficient information to code with codable children: Secondary | ICD-10-CM

## 2016-07-13 NOTE — Progress Notes (Signed)
    JOHNIYAH UPTON 1966/05/26 [id number]        50 y.o.  G2P0011 oh presents for colposcopy due to 2 annual Pap smears in a row with ASCUS negative high-risk HPV. No history of prior abnormal Pap smears.  Past medical history,surgical history, problem list, medications, allergies, family history and social history were all reviewed and documented in the EPIC chart.  Directed ROS with pertinent positives and negatives documented in the history of present illness/assessment and plan.  Exam: [name] assistant Vitals:   07/13/16 0847  BP: 114/74   General appearance:  Normal External BUS vagina normal. Cervix grossly normal. Uterus normal size midline mobile nontender. Adnexa without masses or tenderness.  Colposcopy performed after acetic acid cleanse is adequate using endocervical speculum. No abnormality seen. ECC performed.  Physical Exam  Genitourinary:       Assessment/Plan:  50 y.o. G2P0011 with 2 annual Pap smears and a row showing ASCUS negative high-risk HPV. No history of prior abnormal Pap smears. Colposcopy was adequate using endocervical speculum and normal. ECC performed. Patient will follow up for results. I reviewed with the patient the issues of abnormal Pap smear, ASCUS and dysplasia. Assuming ECC negative them plan annual follow up Pap smear. If otherwise then will triage results.    Anastasio Auerbach MD, 9:14 AM 07/13/2016

## 2016-07-13 NOTE — Patient Instructions (Signed)
Office will call you with biopsy results 

## 2016-07-14 LAB — PATHOLOGY

## 2016-12-07 DIAGNOSIS — F330 Major depressive disorder, recurrent, mild: Secondary | ICD-10-CM | POA: Diagnosis not present

## 2016-12-07 DIAGNOSIS — F4322 Adjustment disorder with anxiety: Secondary | ICD-10-CM | POA: Diagnosis not present

## 2016-12-07 DIAGNOSIS — F411 Generalized anxiety disorder: Secondary | ICD-10-CM | POA: Diagnosis not present

## 2017-03-09 DIAGNOSIS — F411 Generalized anxiety disorder: Secondary | ICD-10-CM | POA: Diagnosis not present

## 2017-03-09 DIAGNOSIS — F330 Major depressive disorder, recurrent, mild: Secondary | ICD-10-CM | POA: Diagnosis not present

## 2017-03-09 DIAGNOSIS — F4322 Adjustment disorder with anxiety: Secondary | ICD-10-CM | POA: Diagnosis not present

## 2017-08-13 DIAGNOSIS — J301 Allergic rhinitis due to pollen: Secondary | ICD-10-CM | POA: Diagnosis not present

## 2017-08-13 DIAGNOSIS — J342 Deviated nasal septum: Secondary | ICD-10-CM | POA: Diagnosis not present

## 2017-08-13 DIAGNOSIS — J320 Chronic maxillary sinusitis: Secondary | ICD-10-CM | POA: Diagnosis not present

## 2017-08-13 DIAGNOSIS — J040 Acute laryngitis: Secondary | ICD-10-CM | POA: Diagnosis not present

## 2017-08-13 DIAGNOSIS — J322 Chronic ethmoidal sinusitis: Secondary | ICD-10-CM | POA: Diagnosis not present

## 2017-09-14 DIAGNOSIS — Z23 Encounter for immunization: Secondary | ICD-10-CM | POA: Diagnosis not present

## 2017-12-04 DIAGNOSIS — F411 Generalized anxiety disorder: Secondary | ICD-10-CM | POA: Diagnosis not present

## 2017-12-04 DIAGNOSIS — F4322 Adjustment disorder with anxiety: Secondary | ICD-10-CM | POA: Diagnosis not present

## 2017-12-04 DIAGNOSIS — F330 Major depressive disorder, recurrent, mild: Secondary | ICD-10-CM | POA: Diagnosis not present

## 2018-07-02 DIAGNOSIS — M549 Dorsalgia, unspecified: Secondary | ICD-10-CM | POA: Diagnosis not present

## 2018-07-02 DIAGNOSIS — R350 Frequency of micturition: Secondary | ICD-10-CM | POA: Diagnosis not present

## 2018-07-02 DIAGNOSIS — R300 Dysuria: Secondary | ICD-10-CM | POA: Diagnosis not present

## 2018-07-04 ENCOUNTER — Other Ambulatory Visit: Payer: Self-pay | Admitting: Internal Medicine

## 2018-07-04 ENCOUNTER — Other Ambulatory Visit: Payer: Self-pay | Admitting: Gynecology

## 2018-07-04 ENCOUNTER — Other Ambulatory Visit: Payer: Self-pay | Admitting: Women's Health

## 2018-07-04 DIAGNOSIS — Z1231 Encounter for screening mammogram for malignant neoplasm of breast: Secondary | ICD-10-CM

## 2018-07-29 ENCOUNTER — Ambulatory Visit
Admission: RE | Admit: 2018-07-29 | Discharge: 2018-07-29 | Disposition: A | Discharge status: Home / Self Care | Payer: Medicare Other | Source: Ambulatory Visit | Attending: Internal Medicine | Admitting: Internal Medicine

## 2018-07-29 DIAGNOSIS — Z1231 Encounter for screening mammogram for malignant neoplasm of breast: Secondary | ICD-10-CM

## 2018-08-01 DIAGNOSIS — Z23 Encounter for immunization: Secondary | ICD-10-CM | POA: Diagnosis not present

## 2018-08-01 DIAGNOSIS — Z13220 Encounter for screening for lipoid disorders: Secondary | ICD-10-CM | POA: Diagnosis not present

## 2018-08-01 DIAGNOSIS — Z0000 Encounter for general adult medical examination without abnormal findings: Secondary | ICD-10-CM | POA: Diagnosis not present

## 2018-08-01 DIAGNOSIS — Z136 Encounter for screening for cardiovascular disorders: Secondary | ICD-10-CM | POA: Diagnosis not present

## 2018-08-01 DIAGNOSIS — Z87820 Personal history of traumatic brain injury: Secondary | ICD-10-CM | POA: Diagnosis not present

## 2018-11-14 DIAGNOSIS — H5203 Hypermetropia, bilateral: Secondary | ICD-10-CM | POA: Diagnosis not present

## 2018-11-14 DIAGNOSIS — H40013 Open angle with borderline findings, low risk, bilateral: Secondary | ICD-10-CM | POA: Diagnosis not present

## 2018-11-22 DIAGNOSIS — D124 Benign neoplasm of descending colon: Secondary | ICD-10-CM | POA: Diagnosis not present

## 2018-11-22 DIAGNOSIS — D123 Benign neoplasm of transverse colon: Secondary | ICD-10-CM | POA: Diagnosis not present

## 2018-11-22 DIAGNOSIS — K635 Polyp of colon: Secondary | ICD-10-CM | POA: Diagnosis not present

## 2018-11-22 DIAGNOSIS — Z1211 Encounter for screening for malignant neoplasm of colon: Secondary | ICD-10-CM | POA: Diagnosis not present

## 2018-11-26 DIAGNOSIS — D124 Benign neoplasm of descending colon: Secondary | ICD-10-CM | POA: Diagnosis not present

## 2018-11-26 DIAGNOSIS — D123 Benign neoplasm of transverse colon: Secondary | ICD-10-CM | POA: Diagnosis not present

## 2018-11-26 DIAGNOSIS — K635 Polyp of colon: Secondary | ICD-10-CM | POA: Diagnosis not present

## 2018-12-21 IMAGING — MG DIGITAL SCREENING BILATERAL MAMMOGRAM WITH TOMO AND CAD
8 series · 9 of 24 positions shown · non-contrast
Comparison: Previous exam(s).

CLINICAL DATA: Screening.

EXAM:
DIGITAL SCREENING BILATERAL MAMMOGRAM WITH TOMO AND CAD

[L MLO synth-2D]
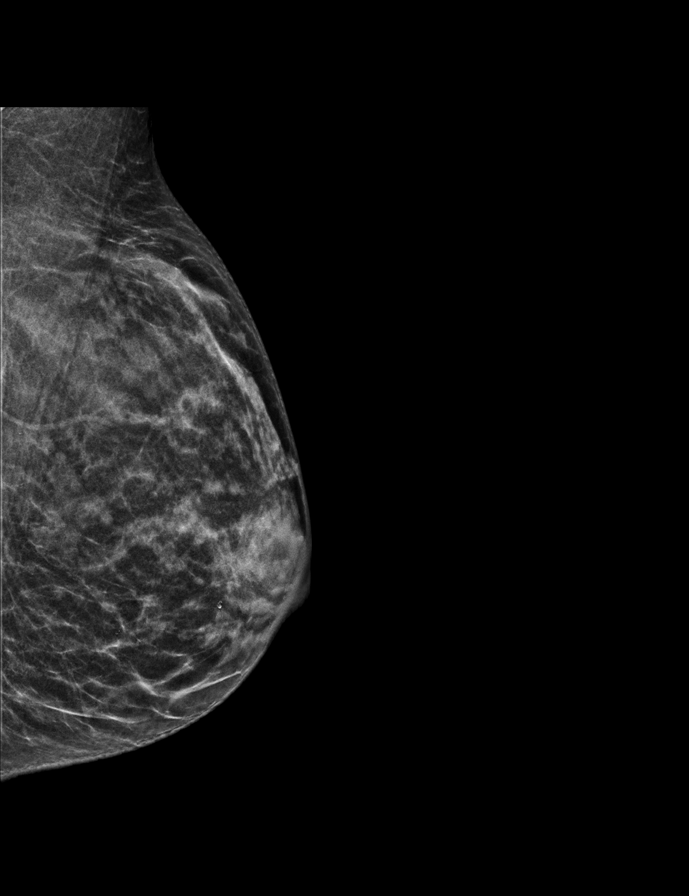

[R MLO synth-2D]
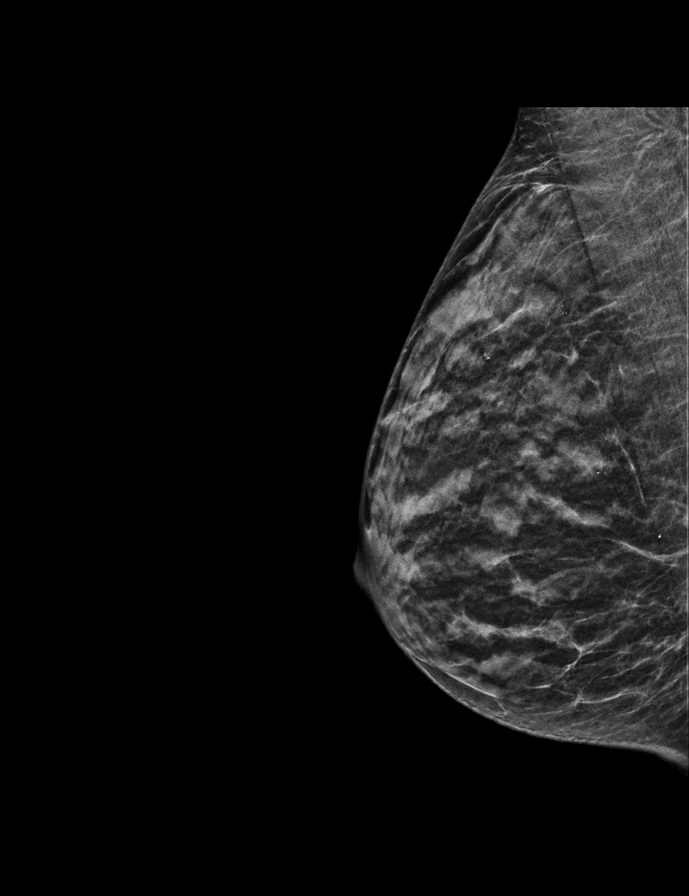

[L CC synth-2D]
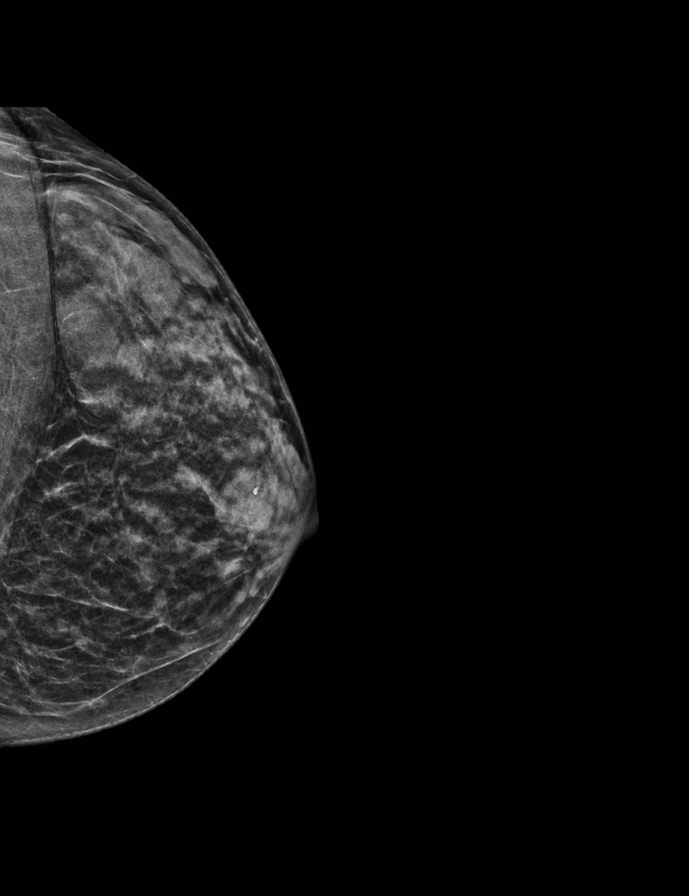

[R CC synth-2D]
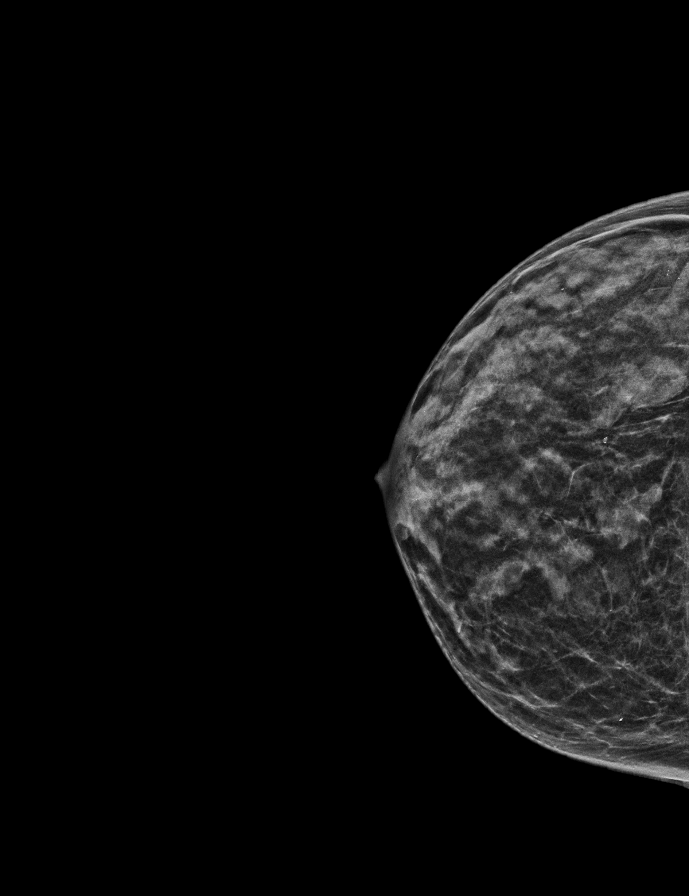

[R MLO tomo · 2 of 34 frames shown]
[frame 12/34]
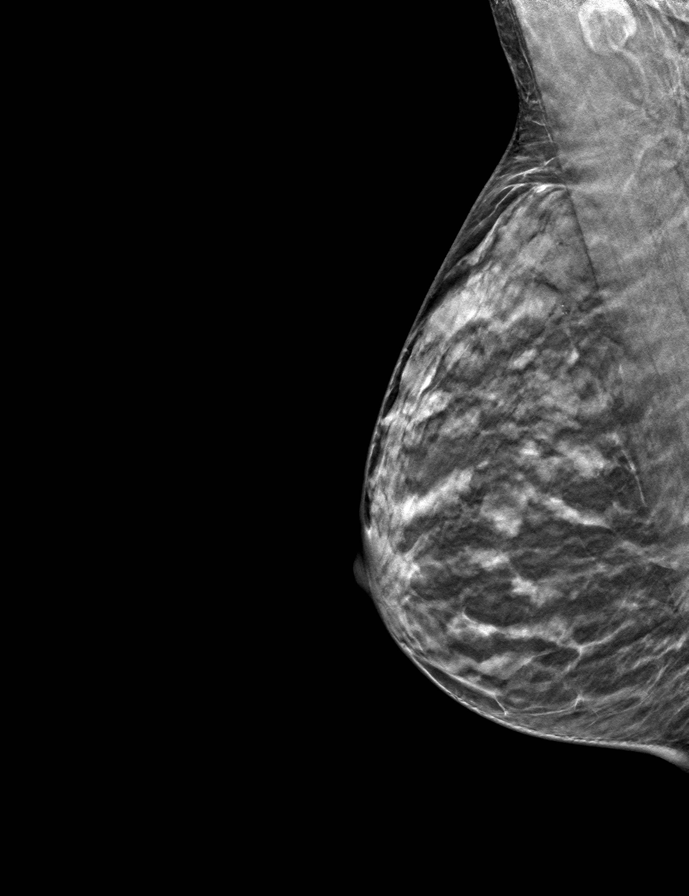
[frame 17/34]
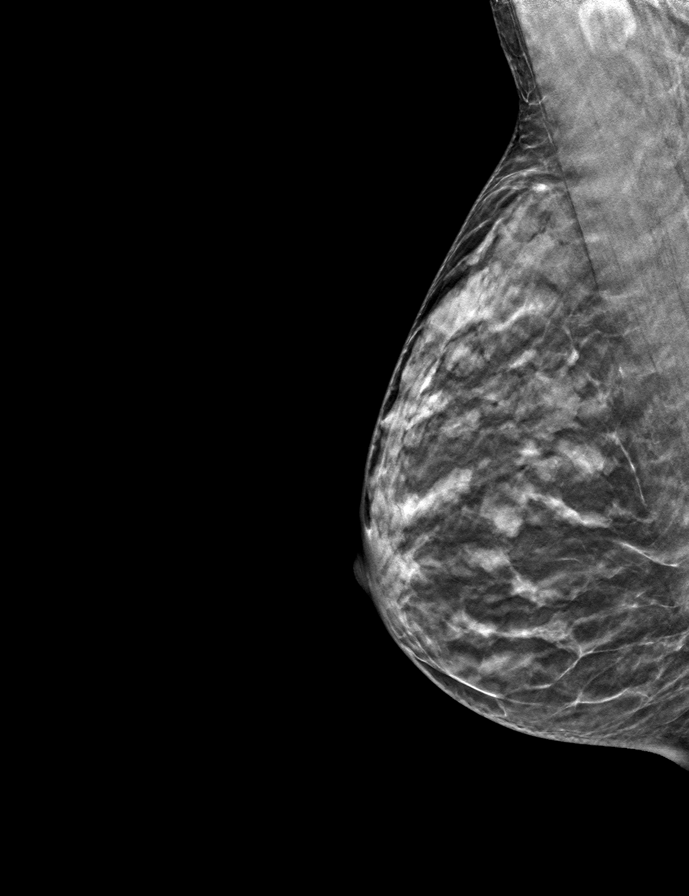

[R CC tomo · tomo slice 17/34.0]
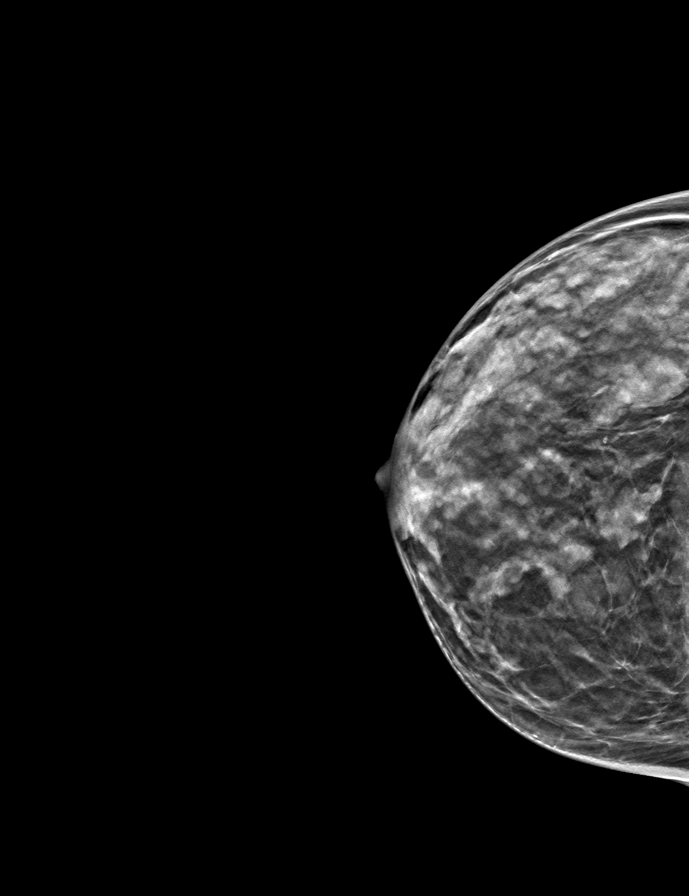

[L CC tomo · tomo slice 19/37.0]
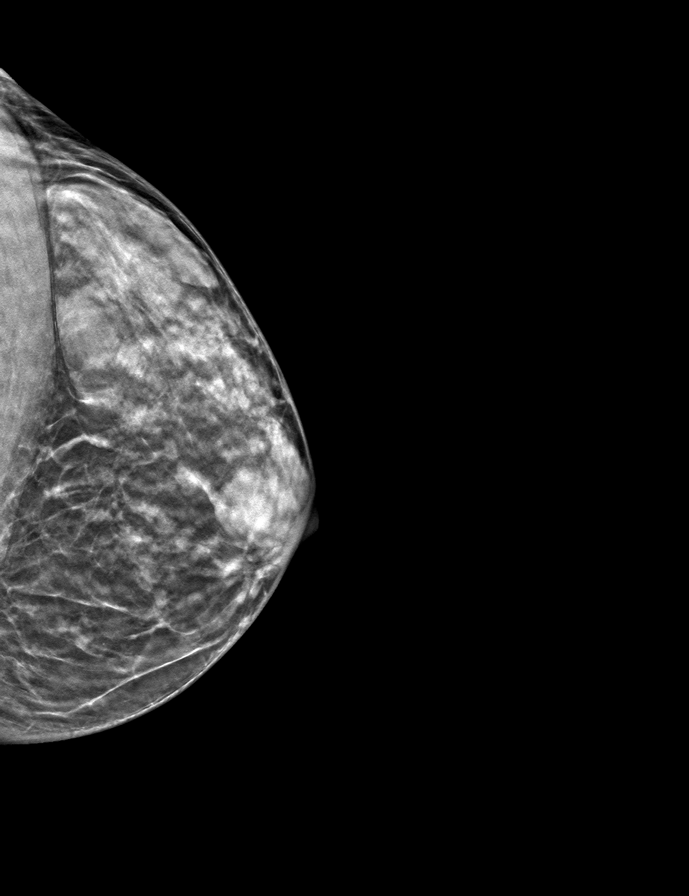

[L MLO tomo · tomo slice 19/38.0]
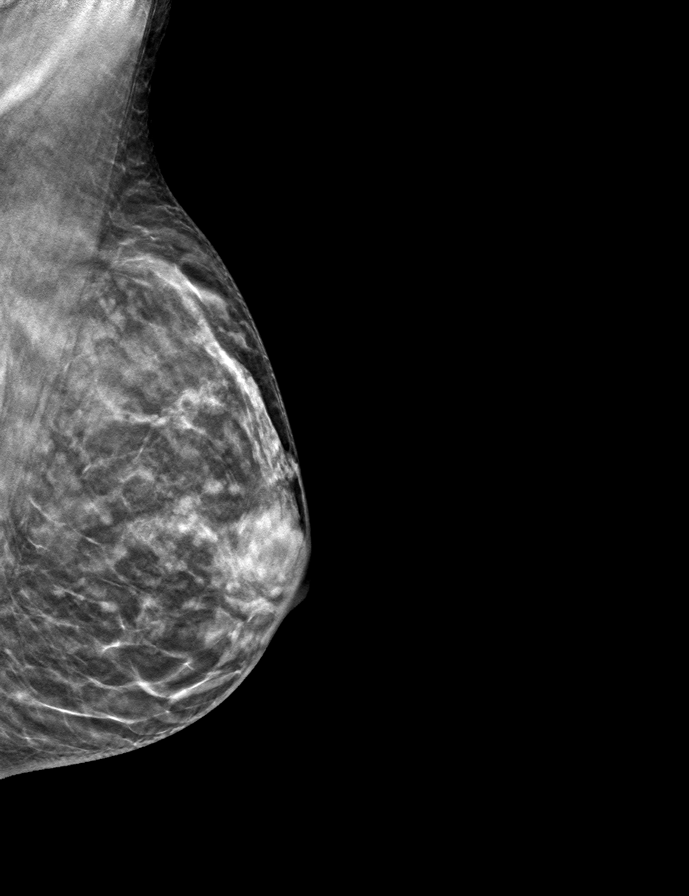

[9 of 24 positions shown; findings below may reference images not displayed]

ACR Breast Density Category c: The breast tissue is heterogeneously
dense, which may obscure small masses.
FINDINGS: There are no findings suspicious for malignancy. Images were
processed with CAD.
IMPRESSION: No mammographic evidence of malignancy. A result letter of this
screening mammogram will be mailed directly to the patient.

RECOMMENDATION:
Screening mammogram in one year. (Code:FT-U-LHB)

BI-RADS CATEGORY  1: Negative.

## 2019-01-15 DIAGNOSIS — J322 Chronic ethmoidal sinusitis: Secondary | ICD-10-CM | POA: Diagnosis not present

## 2019-01-15 DIAGNOSIS — J040 Acute laryngitis: Secondary | ICD-10-CM | POA: Diagnosis not present

## 2019-01-15 DIAGNOSIS — B370 Candidal stomatitis: Secondary | ICD-10-CM | POA: Diagnosis not present

## 2019-01-15 DIAGNOSIS — J3081 Allergic rhinitis due to animal (cat) (dog) hair and dander: Secondary | ICD-10-CM | POA: Diagnosis not present

## 2019-01-15 DIAGNOSIS — J301 Allergic rhinitis due to pollen: Secondary | ICD-10-CM | POA: Diagnosis not present

## 2019-01-15 DIAGNOSIS — J320 Chronic maxillary sinusitis: Secondary | ICD-10-CM | POA: Diagnosis not present

## 2019-01-15 DIAGNOSIS — R05 Cough: Secondary | ICD-10-CM | POA: Diagnosis not present

## 2019-03-24 DIAGNOSIS — R0989 Other specified symptoms and signs involving the circulatory and respiratory systems: Secondary | ICD-10-CM | POA: Diagnosis not present

## 2019-03-24 DIAGNOSIS — J309 Allergic rhinitis, unspecified: Secondary | ICD-10-CM | POA: Diagnosis not present

## 2019-06-12 DIAGNOSIS — H40013 Open angle with borderline findings, low risk, bilateral: Secondary | ICD-10-CM | POA: Diagnosis not present

## 2019-08-06 ENCOUNTER — Encounter: Payer: Self-pay | Admitting: Gynecology

## 2019-08-08 DIAGNOSIS — Z23 Encounter for immunization: Secondary | ICD-10-CM | POA: Diagnosis not present

## 2020-08-10 DIAGNOSIS — I10 Essential (primary) hypertension: Secondary | ICD-10-CM | POA: Diagnosis not present

## 2020-08-10 DIAGNOSIS — J9801 Acute bronchospasm: Secondary | ICD-10-CM | POA: Diagnosis not present

## 2020-08-10 DIAGNOSIS — J3081 Allergic rhinitis due to animal (cat) (dog) hair and dander: Secondary | ICD-10-CM | POA: Diagnosis not present

## 2020-08-10 DIAGNOSIS — T7840XA Allergy, unspecified, initial encounter: Secondary | ICD-10-CM | POA: Diagnosis not present

## 2020-08-10 DIAGNOSIS — R0602 Shortness of breath: Secondary | ICD-10-CM | POA: Diagnosis not present

## 2020-08-10 DIAGNOSIS — R0902 Hypoxemia: Secondary | ICD-10-CM | POA: Diagnosis not present

## 2020-08-10 DIAGNOSIS — R062 Wheezing: Secondary | ICD-10-CM | POA: Diagnosis not present

## 2020-09-18 DIAGNOSIS — Z23 Encounter for immunization: Secondary | ICD-10-CM | POA: Diagnosis not present

## 2020-09-22 ENCOUNTER — Ambulatory Visit
Admission: RE | Admit: 2020-09-22 | Discharge: 2020-09-22 | Disposition: A | Discharge status: Home / Self Care | Payer: Medicare Other | Source: Ambulatory Visit | Attending: Physician Assistant | Admitting: Physician Assistant

## 2020-09-22 ENCOUNTER — Other Ambulatory Visit: Payer: Self-pay | Admitting: Physician Assistant

## 2020-09-22 DIAGNOSIS — M79641 Pain in right hand: Secondary | ICD-10-CM

## 2020-09-22 DIAGNOSIS — M19041 Primary osteoarthritis, right hand: Secondary | ICD-10-CM | POA: Diagnosis not present

## 2020-09-22 DIAGNOSIS — S6991XA Unspecified injury of right wrist, hand and finger(s), initial encounter: Secondary | ICD-10-CM | POA: Diagnosis not present

## 2020-10-11 DIAGNOSIS — J3081 Allergic rhinitis due to animal (cat) (dog) hair and dander: Secondary | ICD-10-CM | POA: Diagnosis not present

## 2020-10-11 DIAGNOSIS — J4520 Mild intermittent asthma, uncomplicated: Secondary | ICD-10-CM | POA: Diagnosis not present

## 2020-10-12 ENCOUNTER — Ambulatory Visit: Payer: Medicare Other | Admitting: Allergy

## 2021-02-14 IMAGING — DX DG HAND COMPLETE 3+V*R*
3 series · 3 of 3 positions shown · non-contrast
Comparison: None.

CLINICAL DATA: Pain at base of thumb for 3 years. No recent injury.
Multiple remote injuries to right hand.

EXAM:
RIGHT HAND - COMPLETE 3+ VIEW

[dg hand complete right (1 of 3)]
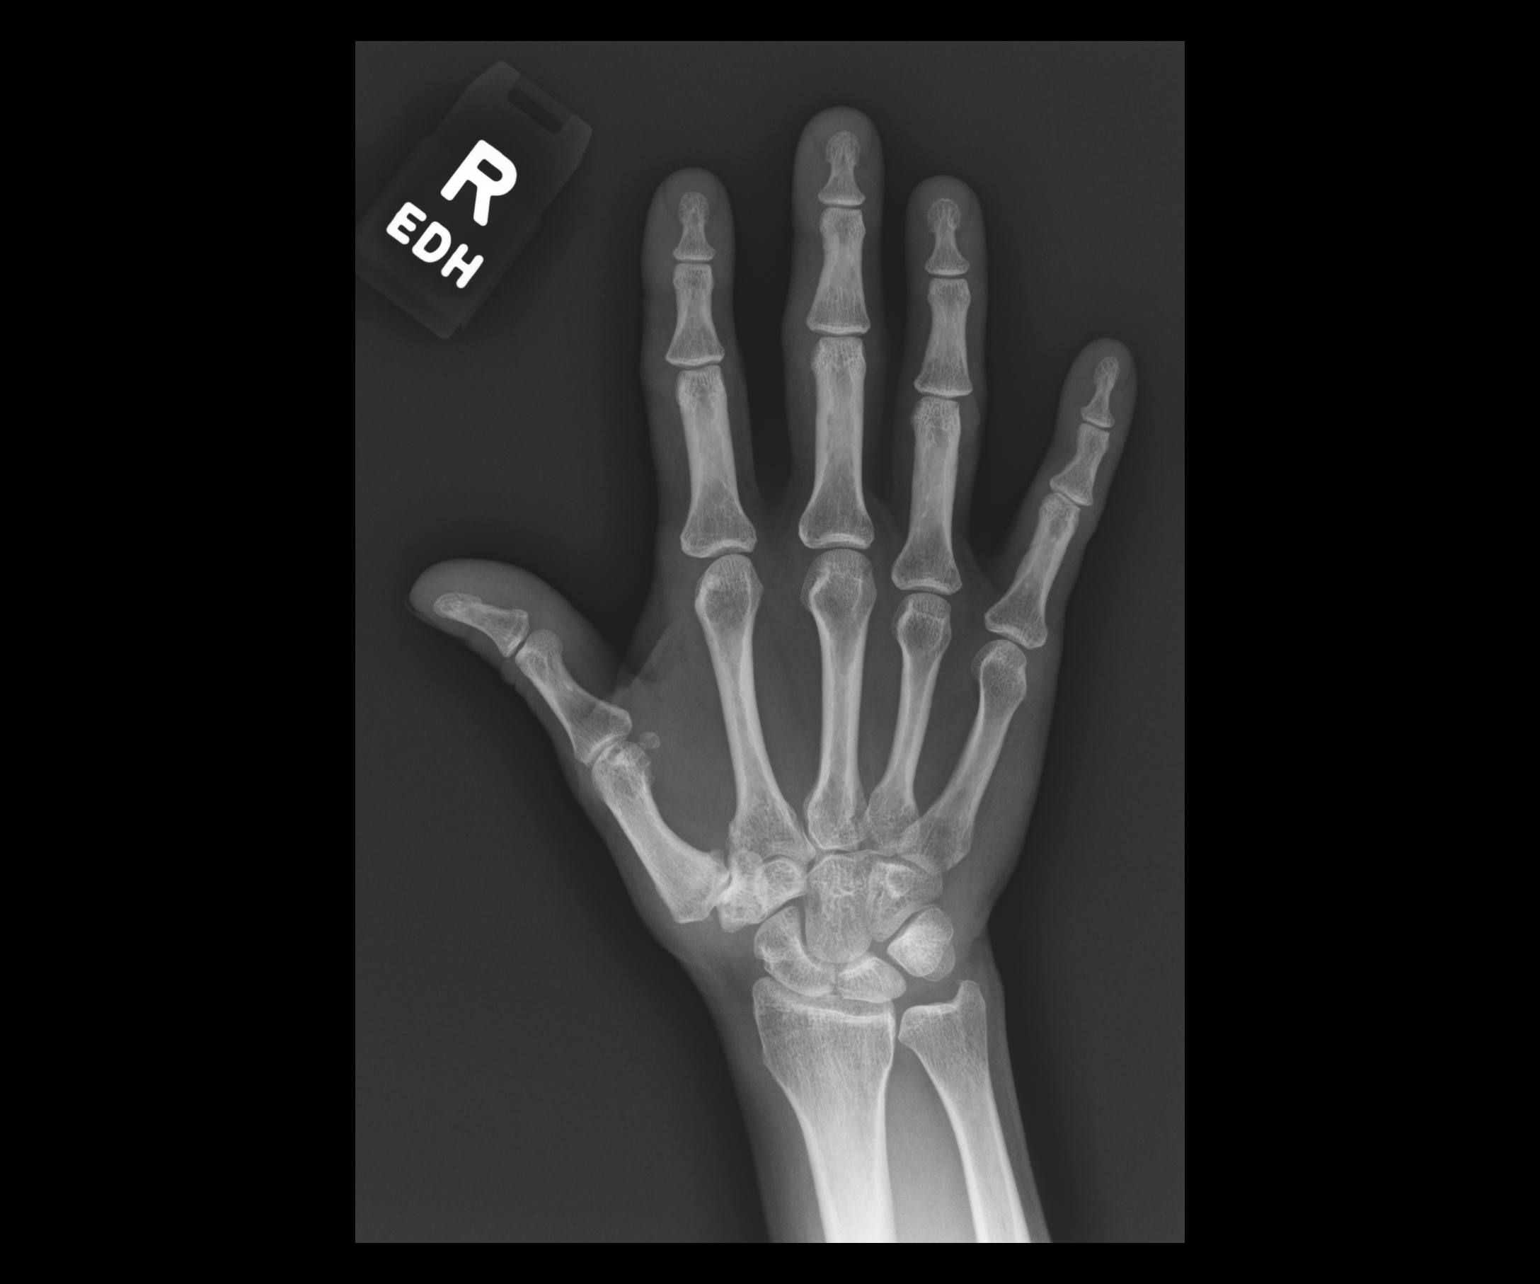

[dg hand complete right (2 of 3)]
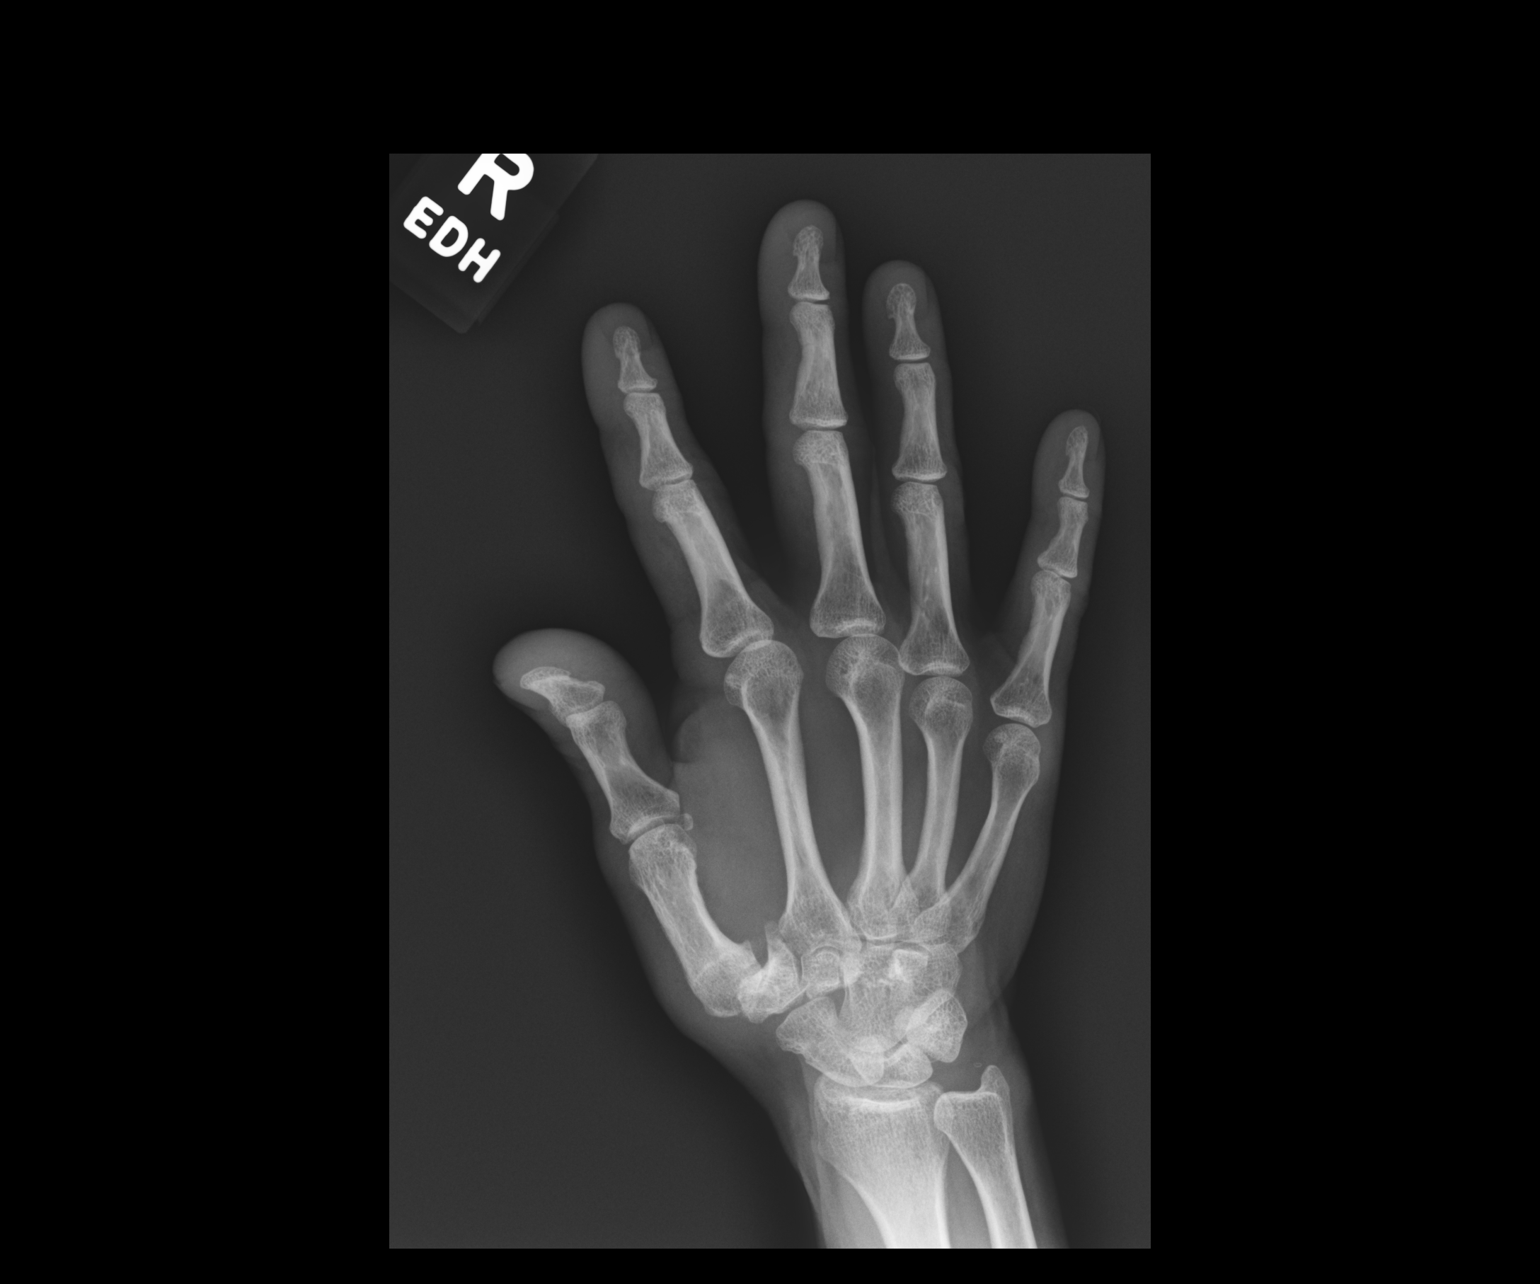

[dg hand complete right (3 of 3)]
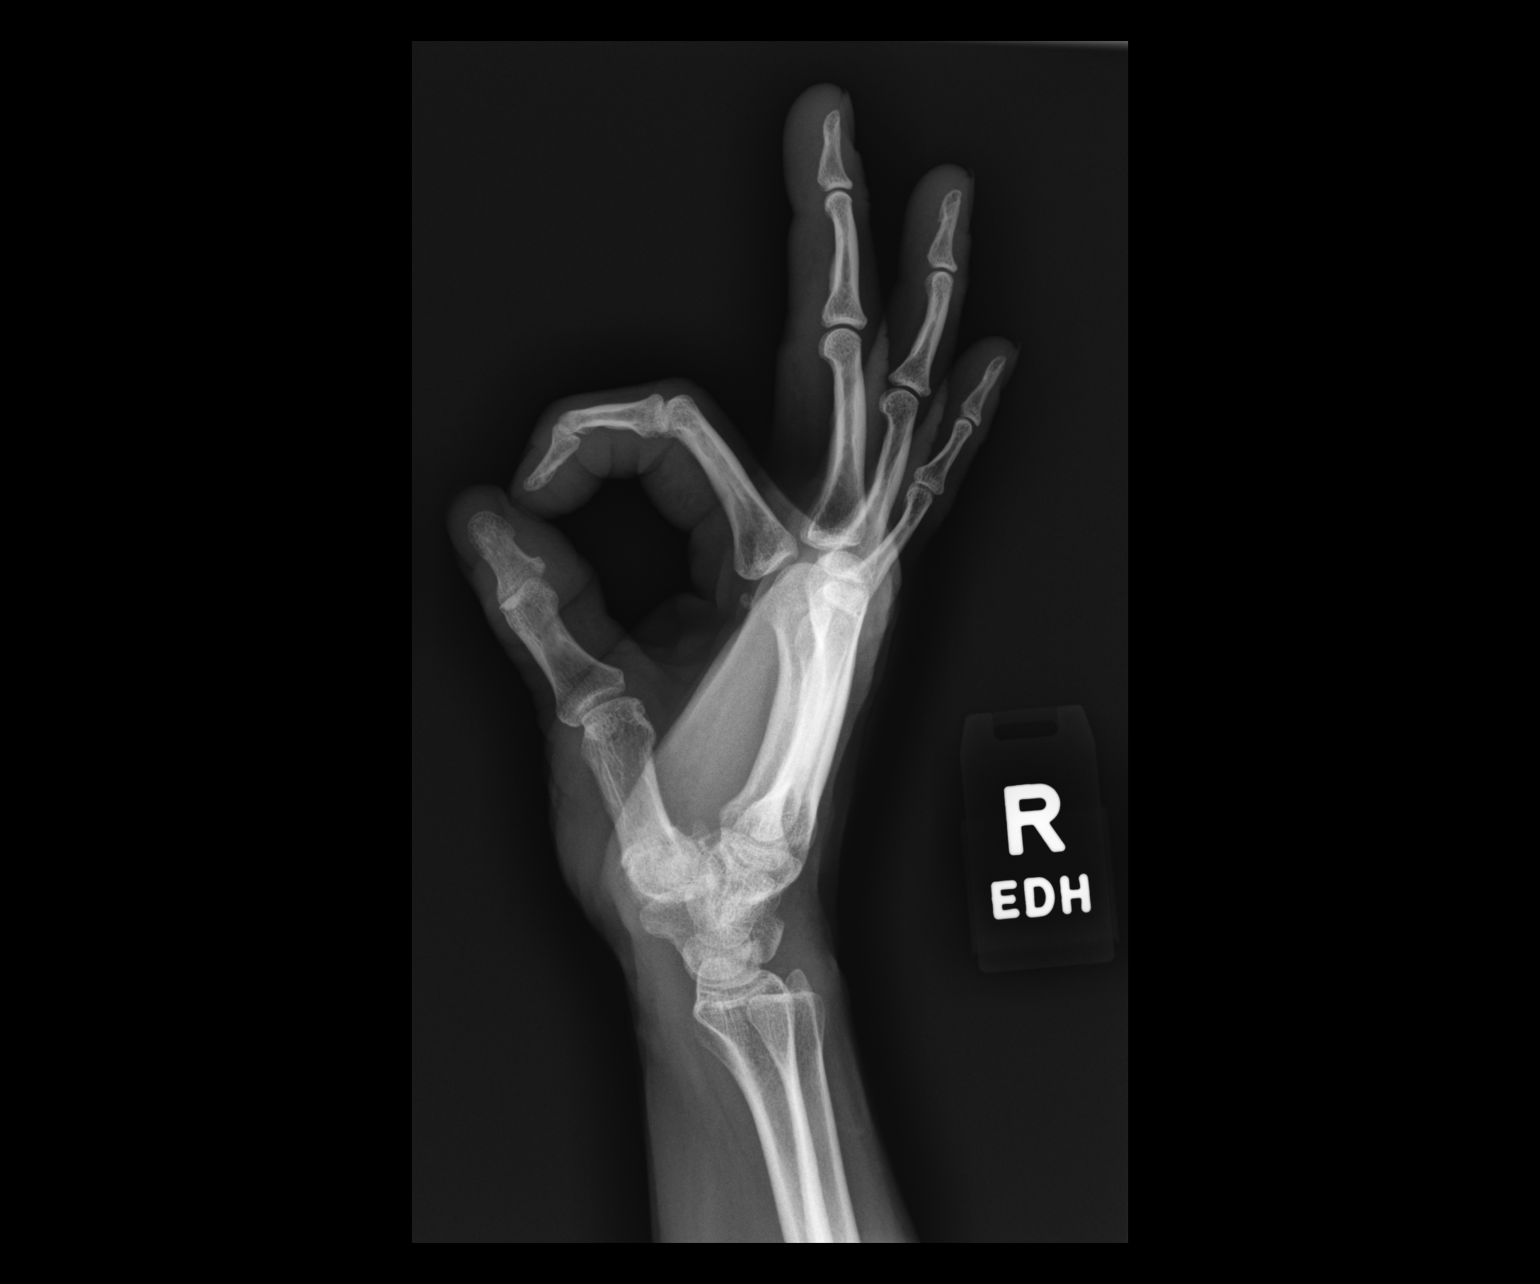

[3 of 3 positions shown; findings below may reference images not displayed]

FINDINGS: No acute fracture or dislocation. Moderate degenerative changes at
the first carpometacarpal joint, with joint space narrowing and
subchondral sclerosis. Other joint spaces are maintained. No
evidence of inflammatory arthropathy.
IMPRESSION: Moderate osteoarthritis at the base of the thumb.

## 2021-06-29 DIAGNOSIS — J3081 Allergic rhinitis due to animal (cat) (dog) hair and dander: Secondary | ICD-10-CM | POA: Diagnosis not present

## 2021-06-29 DIAGNOSIS — J4520 Mild intermittent asthma, uncomplicated: Secondary | ICD-10-CM | POA: Diagnosis not present

## 2021-08-19 DIAGNOSIS — Z23 Encounter for immunization: Secondary | ICD-10-CM | POA: Diagnosis not present

## 2021-12-13 DIAGNOSIS — H40023 Open angle with borderline findings, high risk, bilateral: Secondary | ICD-10-CM | POA: Diagnosis not present

## 2021-12-13 DIAGNOSIS — D23121 Other benign neoplasm of skin of left upper eyelid, including canthus: Secondary | ICD-10-CM | POA: Diagnosis not present

## 2021-12-13 DIAGNOSIS — H5203 Hypermetropia, bilateral: Secondary | ICD-10-CM | POA: Diagnosis not present

## 2021-12-26 DIAGNOSIS — D23121 Other benign neoplasm of skin of left upper eyelid, including canthus: Secondary | ICD-10-CM | POA: Diagnosis not present

## 2022-08-19 DIAGNOSIS — Z23 Encounter for immunization: Secondary | ICD-10-CM | POA: Diagnosis not present
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
## 2022-10-16 ENCOUNTER — Ambulatory Visit (INDEPENDENT_AMBULATORY_CARE_PROVIDER_SITE_OTHER): Payer: Medicare Other | Admitting: Allergy

## 2022-10-16 ENCOUNTER — Encounter: Payer: Self-pay | Admitting: Allergy

## 2022-10-16 ENCOUNTER — Other Ambulatory Visit: Payer: Self-pay

## 2022-10-16 VITALS — BP 118/76 | HR 66 | Temp 98.3°F | Resp 14 | Ht 60.0 in | Wt 109.2 lb

## 2022-10-16 DIAGNOSIS — J3089 Other allergic rhinitis: Secondary | ICD-10-CM | POA: Diagnosis not present

## 2022-10-16 DIAGNOSIS — J4540 Moderate persistent asthma, uncomplicated: Secondary | ICD-10-CM | POA: Diagnosis not present

## 2022-10-16 DIAGNOSIS — H1013 Acute atopic conjunctivitis, bilateral: Secondary | ICD-10-CM

## 2022-10-16 DIAGNOSIS — Z720 Tobacco use: Secondary | ICD-10-CM | POA: Diagnosis not present

## 2022-10-16 DIAGNOSIS — Z91038 Other insect allergy status: Secondary | ICD-10-CM | POA: Diagnosis not present

## 2022-10-16 DIAGNOSIS — Z889 Allergy status to unspecified drugs, medicaments and biological substances status: Secondary | ICD-10-CM

## 2022-10-16 MED ORDER — CROMOLYN SODIUM 4 % OP SOLN: 1.0000 [drp] | Freq: Four times a day (QID) | OPHTHALMIC | 3 refills | Status: DC | PRN

## 2022-10-16 MED ORDER — RYALTRIS 665-25 MCG/ACT NA SUSP: 1.0000 | Freq: Two times a day (BID) | NASAL | 5 refills | Status: DC

## 2022-10-16 MED ORDER — BUDESONIDE-FORMOTEROL FUMARATE 80-4.5 MCG/ACT IN AERO: 2.0000 | INHALATION_SPRAY | Freq: Every day | RESPIRATORY_TRACT | 5 refills | Status: DC

## 2022-10-16 NOTE — Patient Instructions (Signed)
Today's skin testing showed: Positive to rabbit. Borderline to dog. Negative to common foods.   Results given.  Environmental allergies Start environmental control measures as below. Use over the counter antihistamines such as Zyrtec (cetirizine), Claritin (loratadine), Allegra (fexofenadine), or Xyzal (levocetirizine) daily as needed. May take twice a day during allergy flares. May switch antihistamines every few months. Start Ryaltris (olopatadine + mometasone nasal spray combination) 1-2 sprays per nostril twice a day. Sample given. This replaces your other nasal sprays. If this works well for you, then have [facility] ship the medication to your home - prescription already sent in.  Nasal saline spray (i.e., Simply Saline) or nasal saline lavage (i.e., NeilMed) is recommended as needed and prior to medicated nasal sprays. Use cromolyn 4% 1 drop in each eye up to four times a day as needed for itchy/watery eyes.  Get bloodwork We are ordering labs, so please allow 1-2 weeks for the results to come back. With the newly implemented Cures Act, the labs might be visible to you at the same time that they become visible to me. However, I will not address the results until all of the results are back, so please be patient.  In the meantime, continue recommendations in your patient instructions, including avoidance measures (if applicable), until you hear from me.  Asthma Recommend quitting smoking. Daily controller medication(s): Symbicort 71mg 2 puffs once a day with spacer and rinse mouth afterwards. During respiratory infections/flares:  Start Symbicort 853m 2 puffs twice a day for 1-2 weeks until your breathing symptoms return to baseline.  Pretreat with albuterol 2 puffs. May use albuterol rescue inhaler 2 puffs every 4 to 6 hours as needed for shortness of breath, chest tightness, coughing, and wheezing. Monitor frequency of use.  Breathing control goals:  Full participation in all  desired activities (may need albuterol before activity) Albuterol use two times or less a week on average (not counting use with activity) Cough interfering with sleep two times or less a month Oral steroids no more than once a year No hospitalizations   Multiple drug allergies Penicillin allergy: Consider penicillin allergy skin testing and in office drug challenge in the future.  Over 90% of people with history of penicillin allergy which occurred over 10 years ago are found to be non-allergic.  You must be off antihistamines for 3-5 days before. Plan on being in the office for 2-3 hours. You must call to schedule an appointment and specify it's for a drug challenge.  A few days prior to the appointment, I will send in a prescription for amoxicillin liquid which you must bring to the appointment as well.   Follow up in 2 months or sooner if needed.    Pet Allergen Avoidance: Contrary to popular opinion, there are no "hypoallergenic" breeds of dogs or cats. That is because people are not allergic to an animal's hair, but to an allergen found in the animal's saliva, dander (dead skin flakes) or urine. Pet allergy symptoms typically occur within minutes. For some people, symptoms can build up and become most severe 8 to 12 hours after contact with the animal. People with severe allergies can experience reactions in public places if dander has been transported on the pet owners' clothing. Keeping an animal outdoors is only a partial solution, since homes with pets in the yard still have higher concentrations of animal allergens. Before getting a pet, ask your allergist to determine if you are allergic to animals. If your pet is already considered part  of your family, try to minimize contact and keep the pet out of the bedroom and other rooms where you spend a great deal of time. As with dust mites, vacuum carpets often or replace carpet with a hardwood floor, tile or linoleum. High-efficiency  particulate air (HEPA) cleaners can reduce allergen levels over time. While dander and saliva are the source of cat and dog allergens, urine is the source of allergens from rabbits, hamsters, mice and [place] pigs; so ask a non-allergic family member to clean the animal's cage. If you have a pet allergy, talk to your allergist about the potential for allergy immunotherapy (allergy shots). This strategy can often provide long-term relief.

## 2022-10-17 ENCOUNTER — Encounter: Payer: Self-pay | Admitting: Allergy

## 2022-10-17 ENCOUNTER — Telehealth: Payer: Self-pay

## 2022-10-17 MED ORDER — FLUTICASONE-SALMETEROL 115-21 MCG/ACT IN AERO: 2.0000 | INHALATION_SPRAY | Freq: Two times a day (BID) | RESPIRATORY_TRACT | 3 refills | Status: DC

## 2022-10-17 NOTE — Telephone Encounter (Signed)
Pts insurance does not cover symbicort but will cover advair hfa or diskus, o breo please advise to change in theraphy

## 2022-10-17 NOTE — Telephone Encounter (Signed)
Patient called in - DOB verified - requesting lab results - advised lab results usually takes 1-2 weeks to results - she will be contacted once results have been reviewed by provider - recommendations advised for next step via myChart or contacted by our office.  Patient verbalized understanding, no further questions.

## 2022-10-17 NOTE — Assessment & Plan Note (Signed)
Avoid meds on allergy list.  Consider penicillin allergy skin testing and in office drug challenge in the future.  Over 90% of people with history of penicillin allergy which occurred over 10 years ago are found to be non-allergic.

## 2022-10-17 NOTE — Addendum Note (Signed)
Addended by: Garnet Sierras on: 10/17/2022 10:41 AM   Modules accepted: Orders

## 2022-10-17 NOTE — Assessment & Plan Note (Signed)
.   See assessment and plan as above. 

## 2022-10-17 NOTE — Assessment & Plan Note (Signed)
Diagnosed a few years ago. Using albuterol 1 puff daily x 1 month with good benefit. Today's spirometry showed: normal pattern with 9% improvement in FEV1 post bronchodilator treatment. Clinically feeling unchanged.   Strongly encouraged smoking cessation. Given daily symptoms will start a controller medication. Daily controller medication(s): Symbicort 41mg 2 puffs once a day with spacer and rinse mouth afterwards. Insurance won't cover Symbicort so sent in Advair 1119m instead.  During respiratory infections/flares:  Start Symbicort 8026m2 puffs twice a day for 1-2 weeks until your breathing symptoms return to baseline.  Pretreat with albuterol 2 puffs. May use albuterol rescue inhaler 2 puffs every 4 to 6 hours as needed for shortness of breath, chest tightness, coughing, and wheezing. Monitor frequency of use.  Get spirometry at next visit.

## 2022-10-17 NOTE — Assessment & Plan Note (Addendum)
Perennial rhinoconjunctivitis symptoms for 40+ years.  Tried Zyrtec, Flonase with some benefit.  Used to be on AIT as a teenager.  Denies reflux.  Current smoker. 2 dogs, 1 rabbit outdoors. Today's skin testing showed: Positive to rabbit. Borderline to dog. Start environmental control measures as below. Use over the counter antihistamines such as Zyrtec (cetirizine), Claritin (loratadine), Allegra (fexofenadine), or Xyzal (levocetirizine) daily as needed. May take twice a day during allergy flares. May switch antihistamines every few months. Start Ryaltris (olopatadine + mometasone nasal spray combination) 1-2 sprays per nostril twice a day. Sample given. This replaces your other nasal sprays. If this works well for you, then have [facility] ship the medication to your home - prescription already sent in.  Nasal saline spray (i.e., Simply Saline) or nasal saline lavage (i.e., NeilMed) is recommended as needed and prior to medicated nasal sprays. Use cromolyn 4% 1 drop in each eye up to four times a day as needed for itchy/watery eyes.  Get bloodwork as her skin testing results do not explain her symptoms. If negative will refer to ENT next.  Encouraged smoking cessation which is most likely contributing to her symptoms as well.

## 2022-10-17 NOTE — Telephone Encounter (Signed)
Sent in advair 140mg.

## 2022-10-17 NOTE — Assessment & Plan Note (Signed)
Chest tightness after sting requiring benadryl. No prior work up. Continue to avoid. Added on hymenoptera panel to bloodwork. Will need to order tryptase if positive. For mild symptoms you can take over the counter antihistamines such as Benadryl and monitor symptoms closely. If symptoms worsen or if you have severe symptoms including breathing issues, throat closure, significant swelling, whole body hives, severe diarrhea and vomiting, lightheadedness then seek immediate medical care.

## 2022-10-19 LAB — ALLERGENS W/TOTAL IGE AREA 2
Alternaria Alternata IgE: <0.1 kU/L
Aspergillus Fumigatus IgE: <0.1 kU/L
Bermuda Grass IgE: <0.1 kU/L
Cat Dander IgE: 0.25 kU/L — AB
Cedar, Mountain IgE: <0.1 kU/L
Cladosporium Herbarum IgE: <0.1 kU/L
Cockroach, German IgE: <0.1 kU/L
Common Silver Birch IgE: <0.1 kU/L
Cottonwood IgE: <0.1 kU/L
D Farinae IgE: <0.1 kU/L
D Pteronyssinus IgE: <0.1 kU/L
Dog Dander IgE: 2.55 kU/L — AB
Elm, American IgE: <0.1 kU/L
IgE (Immunoglobulin E), Serum: 606 IU/mL — ABNORMAL HIGH (ref 6–495)
Johnson Grass IgE: <0.1 kU/L
Maple/Box Elder IgE: <0.1 kU/L
Mouse Urine IgE: 3.58 kU/L — AB
Oak, White IgE: <0.1 kU/L
Pecan, Hickory IgE: <0.1 kU/L
Penicillium Chrysogen IgE: <0.1 kU/L
Pigweed, Rough IgE: <0.1 kU/L
Ragweed, Short IgE: <0.1 kU/L
Sheep Sorrel IgE Qn: <0.1 kU/L
Timothy Grass IgE: <0.1 kU/L
White Mulberry IgE: <0.1 kU/L

## 2022-10-23 ENCOUNTER — Encounter: Payer: Self-pay | Admitting: Allergy

## 2022-10-23 ENCOUNTER — Telehealth: Payer: Self-pay

## 2022-10-23 NOTE — Telephone Encounter (Signed)
Per Dr. Maudie Mercury: Please place a referral to ENT to look at sinus anatomy - diagnosis for chronic rhinitis.

## 2022-10-23 NOTE — Progress Notes (Signed)
Please call patient.  Environmental panel was positive to cat, dog and mouse.  This still does not explain her symptoms. Please place a referral to ENT to look at sinus anatomy - diagnosis for chronic rhinitis.  Continue nasal sprays and encourage smoking cessation.  Stinging insect panel was borderline positive to white face hornet and yellow hornet. Continue to avoid. Recommend that she has an epipen for this - ask what pharmacy she would like me to send it to. Thank you.

## 2022-10-30 ENCOUNTER — Encounter: Payer: Self-pay | Admitting: Allergy

## 2022-10-31 NOTE — Telephone Encounter (Signed)
Here is another message Dr Maudie Mercury.  Thanks

## 2022-11-08 ENCOUNTER — Encounter: Payer: Self-pay | Admitting: Allergy

## 2022-11-13 DIAGNOSIS — J40 Bronchitis, not specified as acute or chronic: Secondary | ICD-10-CM | POA: Diagnosis not present

## 2022-11-13 DIAGNOSIS — J329 Chronic sinusitis, unspecified: Secondary | ICD-10-CM | POA: Diagnosis not present

## 2022-11-13 DIAGNOSIS — R21 Rash and other nonspecific skin eruption: Secondary | ICD-10-CM | POA: Diagnosis not present

## 2022-11-15 ENCOUNTER — Ambulatory Visit: Payer: Medicare Other | Admitting: Allergy

## 2022-11-18 LAB — ALLERGEN HYMENOPTERA PANEL
Bumblebee: <0.1 kU/L
Honeybee IgE: <0.1 kU/L
Hornet, White Face, IgE: 0.19 kU/L — AB
Hornet, Yellow, IgE: 0.19 kU/L — AB
Paper Wasp IgE: <0.1 kU/L
Yellow Jacket, IgE: <0.1 kU/L

## 2022-11-18 LAB — SPECIMEN STATUS REPORT

## 2022-11-28 DIAGNOSIS — J019 Acute sinusitis, unspecified: Secondary | ICD-10-CM | POA: Diagnosis not present

## 2022-11-28 DIAGNOSIS — T7840XA Allergy, unspecified, initial encounter: Secondary | ICD-10-CM | POA: Diagnosis not present

## 2022-11-28 DIAGNOSIS — Z87820 Personal history of traumatic brain injury: Secondary | ICD-10-CM | POA: Diagnosis not present

## 2022-12-18 ENCOUNTER — Encounter: Payer: Self-pay | Admitting: Allergy

## 2022-12-18 ENCOUNTER — Other Ambulatory Visit: Payer: Self-pay

## 2022-12-18 ENCOUNTER — Ambulatory Visit (INDEPENDENT_AMBULATORY_CARE_PROVIDER_SITE_OTHER): Payer: Medicare Other | Admitting: Allergy

## 2022-12-18 VITALS — BP 122/80 | HR 70 | Temp 98.2°F | Resp 16 | Wt 109.5 lb

## 2022-12-18 DIAGNOSIS — Z889 Allergy status to unspecified drugs, medicaments and biological substances status: Secondary | ICD-10-CM

## 2022-12-18 DIAGNOSIS — J3089 Other allergic rhinitis: Secondary | ICD-10-CM | POA: Diagnosis not present

## 2022-12-18 DIAGNOSIS — H1013 Acute atopic conjunctivitis, bilateral: Secondary | ICD-10-CM

## 2022-12-18 DIAGNOSIS — Z91038 Other insect allergy status: Secondary | ICD-10-CM | POA: Diagnosis not present

## 2022-12-18 DIAGNOSIS — L309 Dermatitis, unspecified: Secondary | ICD-10-CM

## 2022-12-18 DIAGNOSIS — J4540 Moderate persistent asthma, uncomplicated: Secondary | ICD-10-CM | POA: Diagnosis not present

## 2022-12-18 MED ORDER — FLUOCINOLONE ACETONIDE SCALP 0.01 % EX OIL: TOPICAL_OIL | CUTANEOUS | 1 refills | Status: AC

## 2022-12-18 MED ORDER — CROMOLYN SODIUM 4 % OP SOLN: 1.0000 [drp] | Freq: Four times a day (QID) | OPHTHALMIC | 3 refills | Status: AC | PRN

## 2022-12-18 MED ORDER — RYALTRIS 665-25 MCG/ACT NA SUSP: 1.0000 | Freq: Two times a day (BID) | NASAL | 5 refills | Status: AC

## 2022-12-18 MED ORDER — BUDESONIDE-FORMOTEROL FUMARATE 80-4.5 MCG/ACT IN AERO: 2.0000 | INHALATION_SPRAY | Freq: Every day | RESPIRATORY_TRACT | 5 refills | Status: DC

## 2022-12-18 MED ORDER — EPINEPHRINE 0.3 MG/0.3ML IJ SOAJ: 0.3000 mg | INTRAMUSCULAR | 1 refills | Status: AC | PRN

## 2022-12-18 MED ORDER — ALBUTEROL SULFATE HFA 108 (90 BASE) MCG/ACT IN AERS: 1.0000 | INHALATION_SPRAY | RESPIRATORY_TRACT | 1 refills | Status: AC | PRN

## 2022-12-18 NOTE — Patient Instructions (Addendum)
Environmental allergies Continue environmental control measures for cat, dog, rabbit and mouse (rodent) Continue Zyrtec (cetirizine) daily as needed. May take twice a day during allergy flares. May switch antihistamines every few months. Continue Ryaltris (olopatadine + mometasone nasal spray combination) 1-2 sprays per nostril twice a day.  Nasal saline spray (i.e., Simply Saline) or nasal saline lavage (i.e., NeilMed) is recommended as needed and prior to medicated nasal sprays. Continue Cromolyn 4% 1 drop in each eye up to four times a day as needed for itchy/watery eyes.  You would be eligible for allergy shots directed toward cat and dog if interested in this therapeutic option  Asthma Recommend quitting smoking. Daily controller medication(s): Symbicort 37mg 2 puffs once a day with spacer and rinse mouth afterwards. During respiratory infections/flares:  Start Symbicort 82m 2 puffs twice a day for 1-2 weeks until your breathing symptoms return to baseline.  Pretreat with albuterol 2 puffs. May use albuterol rescue inhaler 2 puffs every 4 to 6 hours as needed for shortness of breath, chest tightness, coughing, and wheezing. Monitor frequency of use.  Breathing control goals:  Full participation in all desired activities (may need albuterol before activity) Albuterol use two times or less a week on average (not counting use with activity) Cough interfering with sleep two times or less a month Oral steroids no more than once a year No hospitalizations   Stinging insect allergy Continue avoidance of stinging insect Have access to epipen in case of allergic reaction Emergency action provided to follow in case of allergic reaction  Multiple drug allergies Penicillin allergy: Consider penicillin allergy skin testing and in office drug challenge in the future.  Over 90% of people with history of penicillin allergy which occurred over 10 years ago are found to be non-allergic.  You  must be off antihistamines for 3-5 days before. Plan on being in the office for 2-3 hours. You must call to schedule an appointment and specify it's for a drug challenge.  A few days prior to the appointment, I will send in a prescription for amoxicillin liquid which you must bring to the appointment as well.   Rash Likely related to your infection that has been treated For scalp itch/rash can use Dermasmoothe scalp oil apply thin layer as needed once a day.   Continue as needed use of Hydrocortisone ointment for the body  Recommend keeping ENT appointment Follow up in 3-4 months or sooner if needed with Dr KiMaudie Mercury

## 2022-12-18 NOTE — Progress Notes (Signed)
Follow-up Note  RE: Melinda Anderson MRN: [id number] DOB: [birth year] Date of Office Visit: 12/18/2022   History of present illness: Melinda Anderson is a 57 y.o. female presenting today for follow-up of allergic rhinitis with conjunctivitis, asthma, medication allergy, hymenoptera allergy. She was last seen in the office on 10/16/22 by Dr Maudie Mercury for initial visit.  She had skin testing at last visit.  Shortly after that she reports she developed an itchy rash on the body.  She did send Dr Maudie Mercury pictures of the rash.  She was recommended to use hydrocortisone ointment.  After developing this rash she states she then developed sore throat and colored mucus and was sick with infection.  She had to go to UC with these symptoms on 10/14/23 and states received an antibiotic and prednisone.  When the symptoms didn't get better quick enough she saw her PCP who prescribed a different antibiotic.  She state she was on 20 days of antibiotics.  She does feel better but states the rash seem to go to her scalp and it itches and is still present but is a bit better.  When she had the height of the rash she states she was advised to take another antihistamine thus she did take zyrtec and allegra.  This was making her too dry so she stopped the allegra and remains on Zyrtec.   She states the cromolyn really helps with her itchy eyes.  She does not have an epipen yet.   She has symbicort to use 2 puffs once a day.  At this time not reporting any issues with her asthma.   Review of systems: Review of Systems  Constitutional: Negative.   HENT: Negative.    Eyes: Negative.   Respiratory: Negative.    Cardiovascular: Negative.   Gastrointestinal: Negative.   Musculoskeletal: Negative.   Skin:  Positive for rash.  Allergic/Immunologic: Negative.   Neurological: Negative.      All other systems negative unless noted above in HPI  Past medical/social/surgical/family history have been reviewed and are unchanged  unless specifically indicated below.  No changes  Medication List: Current Outpatient Medications  Medication Sig Dispense Refill   acetaminophen (TYLENOL) 500 MG tablet Take 1,000 mg by mouth every 6 (six) hours as needed. pain     albuterol (VENTOLIN HFA) 108 (90 Base) MCG/ACT inhaler Inhale 1-2 puffs into the lungs daily as needed.     ALPRAZolam (XANAX) 1 MG tablet Take 1 mg by mouth 3 (three) times daily as needed.     cetirizine (ZYRTEC) 10 MG tablet Take 10 mg by mouth daily.     citalopram (CELEXA) 20 MG tablet Take 20 mg by mouth daily.     cromolyn (OPTICROM) 4 % ophthalmic solution Place 1 drop into both eyes 4 (four) times daily as needed (itchy/watery eyes). 10 mL 3   fluticasone-salmeterol (ADVAIR HFA) 115-21 MCG/ACT inhaler Inhale 2 puffs into the lungs 2 (two) times daily. Take 1-2 weeks during respiratory infections/asthma flares. Use with spacer and rinse mouth afterwards. 1 each 3   Olopatadine-Mometasone (RYALTRIS) 665-25 MCG/ACT SUSP Place 1-2 sprays into the nose in the morning and at bedtime. 29 g 5   No current facility-administered medications for this visit.     Known medication allergies: Allergies  Allergen Reactions   Amoxicillin Other (See Comments)    thrush   Ampicillin Other (See Comments)    Cant take any cillins   Elemental Sulfur Itching   Penicillins Other (See  Comments)    Unknown childhood allergy   Erythromycin Rash     Physical examination: Blood pressure 122/80, pulse 70, temperature 98.2 F (36.8 C), temperature source Temporal, resp. rate 16, weight 109 lb 8 oz (49.7 kg), SpO2 96 %.  General: Alert, interactive, in no acute distress. HEENT: PERRLA, TMs pearly gray, turbinates non-edematous without discharge, post-pharynx non erythematous. Neck: Supple without lymphadenopathy. Lungs: Clear to auscultation without wheezing, rhonchi or rales. {no increased work of breathing. CV: Normal S1, S2 without murmurs. Abdomen: Nondistended,  nontender. Skin: Warm and dry, without lesions or rashes. Extremities:  No clubbing, cyanosis or edema. Neuro:   Grossly intact.  Diagnositics/Labs: Labs:  Component     Latest Ref Rng 10/16/2022  IgE (Immunoglobulin E), Serum     6 - 495 IU/mL 606 (H)   D Pteronyssinus IgE     Class 0 kU/L <0.10   D Farinae IgE     Class 0 kU/L <0.10   Cat Dander IgE     Class 0/I kU/L 0.25 !   Dog Dander IgE     Class III kU/L 2.55 !   [place] Grass IgE     Class 0 kU/L <0.10   Timothy Grass IgE     Class 0 kU/L <0.10   Johnson Grass IgE     Class 0 kU/L <0.10   Cockroach, German IgE     Class 0 kU/L <0.10   Penicillium Chrysogen IgE     Class 0 kU/L <0.10   Cladosporium Herbarum IgE     Class 0 kU/L <0.10   Aspergillus Fumigatus IgE     Class 0 kU/L <0.10   Alternaria Alternata IgE     Class 0 kU/L <0.10   Maple/Box Elder IgE     Class 0 kU/L <0.10   Common Silver Wendee Copp IgE     Class 0 kU/L <0.10   Cedar, [place] IgE     Class 0 kU/L <0.10   Oak, White IgE     Class 0 kU/L <0.10   Elm, American IgE     Class 0 kU/L <0.10   Cottonwood IgE     Class 0 kU/L <0.10   Pecan, Hickory IgE     Class 0 kU/L <0.10   White Mulberry IgE     Class 0 kU/L <0.10   Ragweed, Short IgE     Class 0 kU/L <0.10   Pigweed, Rough IgE     Class 0 kU/L <0.10   Sheep Sorrel IgE Qn     Class 0 kU/L <0.10   Mouse Urine IgE     Class III kU/L 3.58 !   Honeybee IgE     Class 0 kU/L <0.10   Hornet, White Face, IgE     Class 0/I kU/L 0.19 !   Yellow Jacket, IgE     Class 0 kU/L <0.10   Paper Wasp IgE     Class 0 kU/L <0.10   Hornet, Yellow, IgE     Class 0/I kU/L 0.19 !   Bumblebee     Class 0 kU/L <0.10   specimen status report Comment (C)    Assessment and plan:   Environmental allergies Continue environmental control measures for cat, dog, rabbit and mouse (rodent) Continue Zyrtec (cetirizine) daily as needed. May take twice a day during allergy flares. May switch antihistamines  every few months. Continue Ryaltris (olopatadine + mometasone nasal spray combination) 1-2 sprays per nostril twice a day.  Nasal saline  spray (i.e., Simply Saline) or nasal saline lavage (i.e., NeilMed) is recommended as needed and prior to medicated nasal sprays. Continue Cromolyn 4% 1 drop in each eye up to four times a day as needed for itchy/watery eyes.  You would be eligible for allergy shots directed toward cat and dog if interested in this therapeutic option  Asthma Recommend quitting smoking. Daily controller medication(s): Symbicort 84mg 2 puffs once a day with spacer and rinse mouth afterwards. During respiratory infections/flares:  Start Symbicort 823m 2 puffs twice a day for 1-2 weeks until your breathing symptoms return to baseline.  Pretreat with albuterol 2 puffs. May use albuterol rescue inhaler 2 puffs every 4 to 6 hours as needed for shortness of breath, chest tightness, coughing, and wheezing. Monitor frequency of use.  Breathing control goals:  Full participation in all desired activities (may need albuterol before activity) Albuterol use two times or less a week on average (not counting use with activity) Cough interfering with sleep two times or less a month Oral steroids no more than once a year No hospitalizations   Stinging insect allergy Continue avoidance of stinging insect Have access to epipen in case of allergic reaction Emergency action provided to follow in case of allergic reaction  Multiple drug allergies Penicillin allergy: Consider penicillin allergy skin testing and in office drug challenge in the future.  Over 90% of people with history of penicillin allergy which occurred over 10 years ago are found to be non-allergic.  You must be off antihistamines for 3-5 days before. Plan on being in the office for 2-3 hours. You must call to schedule an appointment and specify it's for a drug challenge.  A few days prior to the appointment, I will send in  a prescription for amoxicillin liquid which you must bring to the appointment as well.   Rash Likely related to your infection that has been treated For scalp itch/rash can use Dermasmoothe scalp oil apply thin layer as needed once a day.   Continue as needed use of Hydrocortisone ointment for the body  Recommend keeping ENT appointment Follow up in 3-4 months or sooner if needed with Dr KiMaudie Mercury   I appreciate the opportunity to take part in Winda's care. Please do not hesitate to contact me with questions.  Sincerely,   ShPrudy FeelerMD Allergy/Immunology [facility]

## 2022-12-21 ENCOUNTER — Telehealth: Payer: Self-pay | Admitting: *Deleted

## 2022-12-21 ENCOUNTER — Telehealth: Payer: Self-pay | Admitting: Allergy

## 2022-12-21 MED ORDER — AEROCHAMBER PLUS FLO-VU LARGE MISC: 1.0000 | Freq: Once | 0 refills | Status: AC

## 2022-12-21 NOTE — Telephone Encounter (Signed)
Patient called back and had concerns of her throat. She went to urgent care and was given prednisone and an antibiotic for her symptoms. After talking to her she feels the albuterol and Symbicort inhaler may not have been the cause of her sore throat. She does not have a spacer so I sent on into the pharmacy. She plans to pick it up later today.    I will call her Monday to see if there is any change in her symptoms. She still is taking the prednisone. She has not noticed any red or have any whitish spot in her mouth.

## 2022-12-21 NOTE — Addendum Note (Signed)
Addended by: Hedy Camara L on: 12/21/2022 11:20 AM   Modules accepted: Orders

## 2022-12-21 NOTE — Telephone Encounter (Signed)
Please call patient back.  Make sure she is using her spacer with the inhalers and rinsing her mouth out after each use. I prefer that she use alcohol free listerine for this.  Does her throat look red or have any whitish spot in there?

## 2022-12-21 NOTE — Telephone Encounter (Signed)
Patient states she has had a sore throat that has gotten worse throughout yesterday afternoon into last night. Patient states she used the symbicort on Monday and that is what gave her the sore throat.   Then the night before last night patient used the new albuterol inhaler that was prescribed to her due to some congestion and wheezing. Patient states after using one puff that her throat feels raw and she is miserable. Patient states she also has a headache with the sore throat and will be taking some tylenol to help with the headache. Patient is unsure of what to do, she states she's never had an issue with her old albuterol inhaler.   Please advise  Best contact number: [phone]

## 2022-12-21 NOTE — Telephone Encounter (Signed)
Received a fax from pharmacy stating that Symbicort is not covered and that preferred alternatives are Advair, Breo, or Dulera. Would you like to switch to any of these inhalers?

## 2022-12-22 NOTE — Telephone Encounter (Addendum)
Per provider:  Also forwarding to Dr Maudie Mercury.  This is her patient.   I'm not sure what happened here.  Dr Noel Journey note says she changed to Advair due to symbicort not being covered at her initial visit.    At the visit I had with her to fill in for Dr Maudie Mercury she had the Symbicort.   So I would just do the Advair Dr Maudie Mercury recommended from her note then since she is out of office today.   Called patient - DPR verified - LMOVM regarding the above provider notation.  Sent/Tried in Advair 115-21 mcg/act inhaler to [facility] - 115-21 is not on formulary.    The following inhalers are on patient's formulary:  Advair 45-21, 230-21            Breo Ellipta 200-25, 50-25 Dulera 100-5, 200-5, 50-5  Forwarding message to provider for alternative.

## 2022-12-25 MED ORDER — DULERA 100-5 MCG/ACT IN AERO: 2.0000 | INHALATION_SPRAY | Freq: Two times a day (BID) | RESPIRATORY_TRACT | 5 refills | Status: AC

## 2022-12-25 NOTE — Addendum Note (Signed)
Addended by: Garnet Sierras on: 12/25/2022 04:15 PM   Modules accepted: Orders

## 2022-12-25 NOTE — Telephone Encounter (Signed)
Please call patient and let her know that I sent in [facility] 154mg 2 puffs twice a day with spacer and rinse mouth afterwards.  Apparently this should be covered by her insurance.

## 2022-12-26 NOTE — Telephone Encounter (Signed)
Patient called back and was informed of the Dulera change. She states that she just wanted to used the Albuerol, I advised how the Albuterol is meant to be used for short acting and that the [facility] is maintenance and has a longer acting medication. She states that if it is affordable she will try it but will keep it on for flares and upper respiratory symptoms.

## 2022-12-26 NOTE — Telephone Encounter (Signed)
Noted  

## 2022-12-26 NOTE — Telephone Encounter (Signed)
Called and left a voicemail asking for patient to return call to inform.  

## 2022-12-26 NOTE — Telephone Encounter (Signed)
Called and spoke with patient in regards to change in inhaler. She did not have any further issues. She did not pick up the spacer due to cost through the pharmacy, I did advise that she can always purchase one through [facility] if she want's to find out cheaper. Patient verbalized understanding.

## 2023-01-01 DIAGNOSIS — H9193 Unspecified hearing loss, bilateral: Secondary | ICD-10-CM | POA: Diagnosis not present

## 2023-01-01 DIAGNOSIS — J343 Hypertrophy of nasal turbinates: Secondary | ICD-10-CM | POA: Diagnosis not present

## 2023-01-01 DIAGNOSIS — J3489 Other specified disorders of nose and nasal sinuses: Secondary | ICD-10-CM | POA: Diagnosis not present

## 2023-01-01 DIAGNOSIS — H6993 Unspecified Eustachian tube disorder, bilateral: Secondary | ICD-10-CM | POA: Diagnosis not present

## 2023-01-01 DIAGNOSIS — J309 Allergic rhinitis, unspecified: Secondary | ICD-10-CM | POA: Diagnosis not present

## 2023-01-03 ENCOUNTER — Telehealth: Payer: Self-pay

## 2023-01-03 NOTE — Telephone Encounter (Signed)
Noted  

## 2023-01-03 NOTE — Telephone Encounter (Signed)
Received ENT office notes from Dr. Fredric Dine - [facility] - placed in provider's in basket.  Forwarding message to provider.

## 2023-03-19 ENCOUNTER — Ambulatory Visit: Payer: Medicare Other | Admitting: Allergy

## 2024-02-26 DIAGNOSIS — Z860100 Personal history of colon polyps, unspecified: Secondary | ICD-10-CM | POA: Diagnosis not present

## 2024-02-26 DIAGNOSIS — Z09 Encounter for follow-up examination after completed treatment for conditions other than malignant neoplasm: Secondary | ICD-10-CM | POA: Diagnosis not present

## 2024-10-03 DIAGNOSIS — J208 Acute bronchitis due to other specified organisms: Secondary | ICD-10-CM | POA: Diagnosis not present

## 2024-10-03 DIAGNOSIS — H66003 Acute suppurative otitis media without spontaneous rupture of ear drum, bilateral: Secondary | ICD-10-CM | POA: Diagnosis not present

## 2024-10-13 DIAGNOSIS — H6690 Otitis media, unspecified, unspecified ear: Secondary | ICD-10-CM | POA: Diagnosis not present
